# Patient Record
Sex: Male | Born: 1980 | Race: Black or African American | Hispanic: No | Marital: Married
Health system: Southern US, Community
[De-identification: ages and names within clinical notes are randomized; demographics above are authoritative.]

## PROBLEM LIST (undated history)

## (undated) DIAGNOSIS — Z973 Presence of spectacles and contact lenses: Secondary | ICD-10-CM

## (undated) DIAGNOSIS — K219 Gastro-esophageal reflux disease without esophagitis: Secondary | ICD-10-CM

## (undated) DIAGNOSIS — H9190 Unspecified hearing loss, unspecified ear: Secondary | ICD-10-CM

## (undated) DIAGNOSIS — Z8739 Personal history of other diseases of the musculoskeletal system and connective tissue: Secondary | ICD-10-CM

## (undated) DIAGNOSIS — F419 Anxiety disorder, unspecified: Secondary | ICD-10-CM

## (undated) DIAGNOSIS — T7840XA Allergy, unspecified, initial encounter: Secondary | ICD-10-CM

## (undated) DIAGNOSIS — D72819 Decreased white blood cell count, unspecified: Secondary | ICD-10-CM

## (undated) HISTORY — DX: Anxiety disorder, unspecified: F41.9

## (undated) HISTORY — DX: Presence of spectacles and contact lenses: Z97.3

## (undated) HISTORY — DX: Allergy, unspecified, initial encounter: T78.40XA

## (undated) HISTORY — DX: Gastro-esophageal reflux disease without esophagitis: K21.9

## (undated) HISTORY — DX: Personal history of other diseases of the musculoskeletal system and connective tissue: Z87.39

## (undated) HISTORY — DX: Decreased white blood cell count, unspecified: D72.819

## (undated) HISTORY — DX: Unspecified hearing loss, unspecified ear: H91.90

---

## 2004-02-21 ENCOUNTER — Emergency Department (HOSPITAL_COMMUNITY): Admission: EM | Admit: 2004-02-21 | Discharge: 2004-02-21 | Payer: Self-pay | Admitting: Family Medicine

## 2005-12-03 ENCOUNTER — Emergency Department (HOSPITAL_COMMUNITY): Admission: EM | Admit: 2005-12-03 | Discharge: 2005-12-03 | Payer: Self-pay | Admitting: Emergency Medicine

## 2006-07-09 ENCOUNTER — Ambulatory Visit: Payer: Self-pay | Admitting: Family Medicine

## 2006-08-03 ENCOUNTER — Ambulatory Visit: Payer: Self-pay | Admitting: Family Medicine

## 2006-09-14 ENCOUNTER — Ambulatory Visit: Payer: Self-pay | Admitting: Family Medicine

## 2006-11-19 ENCOUNTER — Ambulatory Visit: Payer: Self-pay | Admitting: Family Medicine

## 2006-12-09 ENCOUNTER — Ambulatory Visit: Payer: Self-pay | Admitting: Family Medicine

## 2007-01-12 ENCOUNTER — Ambulatory Visit: Payer: Self-pay | Admitting: Family Medicine

## 2007-02-24 ENCOUNTER — Ambulatory Visit: Payer: Self-pay | Admitting: Family Medicine

## 2007-05-04 ENCOUNTER — Ambulatory Visit: Payer: Self-pay | Admitting: Family Medicine

## 2007-05-08 ENCOUNTER — Emergency Department (HOSPITAL_COMMUNITY): Admission: EM | Admit: 2007-05-08 | Discharge: 2007-05-08 | Payer: Self-pay | Admitting: Emergency Medicine

## 2007-06-13 ENCOUNTER — Ambulatory Visit: Payer: Self-pay | Admitting: Family Medicine

## 2007-07-18 ENCOUNTER — Ambulatory Visit: Payer: Self-pay | Admitting: Family Medicine

## 2007-09-05 ENCOUNTER — Ambulatory Visit: Payer: Self-pay | Admitting: Family Medicine

## 2007-09-21 ENCOUNTER — Ambulatory Visit: Payer: Self-pay | Admitting: Family Medicine

## 2008-02-28 ENCOUNTER — Ambulatory Visit: Payer: Self-pay | Admitting: Family Medicine

## 2008-03-24 ENCOUNTER — Emergency Department (HOSPITAL_COMMUNITY): Admission: EM | Admit: 2008-03-24 | Discharge: 2008-03-24 | Payer: Self-pay | Admitting: Emergency Medicine

## 2008-03-26 ENCOUNTER — Ambulatory Visit: Payer: Self-pay | Admitting: Family Medicine

## 2008-04-02 ENCOUNTER — Ambulatory Visit: Payer: Self-pay | Admitting: Family Medicine

## 2008-04-09 ENCOUNTER — Ambulatory Visit: Payer: Self-pay | Admitting: Family Medicine

## 2008-04-10 ENCOUNTER — Encounter: Admission: RE | Admit: 2008-04-10 | Discharge: 2008-05-03 | Payer: Self-pay | Admitting: Family Medicine

## 2008-04-23 ENCOUNTER — Ambulatory Visit: Payer: Self-pay | Admitting: Family Medicine

## 2008-05-07 ENCOUNTER — Encounter: Admission: RE | Admit: 2008-05-07 | Discharge: 2008-07-11 | Payer: Self-pay | Admitting: Family Medicine

## 2008-05-14 ENCOUNTER — Ambulatory Visit: Payer: Self-pay | Admitting: Family Medicine

## 2008-07-04 ENCOUNTER — Ambulatory Visit: Payer: Self-pay | Admitting: Family Medicine

## 2008-07-04 ENCOUNTER — Encounter: Payer: Self-pay | Admitting: Cardiology

## 2008-08-15 ENCOUNTER — Ambulatory Visit: Payer: Self-pay | Admitting: Family Medicine

## 2008-08-15 ENCOUNTER — Encounter: Payer: Self-pay | Admitting: Cardiology

## 2008-08-23 DIAGNOSIS — R55 Syncope and collapse: Secondary | ICD-10-CM | POA: Insufficient documentation

## 2008-08-23 DIAGNOSIS — I498 Other specified cardiac arrhythmias: Secondary | ICD-10-CM | POA: Insufficient documentation

## 2008-09-04 ENCOUNTER — Encounter: Payer: Self-pay | Admitting: Cardiology

## 2008-09-05 ENCOUNTER — Ambulatory Visit: Payer: Self-pay | Admitting: Cardiology

## 2008-09-05 ENCOUNTER — Encounter: Payer: Self-pay | Admitting: Cardiology

## 2008-09-05 ENCOUNTER — Ambulatory Visit: Payer: Self-pay

## 2008-09-05 DIAGNOSIS — R42 Dizziness and giddiness: Secondary | ICD-10-CM | POA: Insufficient documentation

## 2008-09-05 DIAGNOSIS — R079 Chest pain, unspecified: Secondary | ICD-10-CM | POA: Insufficient documentation

## 2008-09-12 ENCOUNTER — Ambulatory Visit: Payer: Self-pay

## 2008-09-12 ENCOUNTER — Encounter: Payer: Self-pay | Admitting: Cardiology

## 2008-09-20 ENCOUNTER — Encounter: Payer: Self-pay | Admitting: Cardiology

## 2008-09-26 ENCOUNTER — Telehealth: Payer: Self-pay | Admitting: Cardiology

## 2008-10-05 ENCOUNTER — Ambulatory Visit: Payer: Self-pay | Admitting: Family Medicine

## 2008-11-14 ENCOUNTER — Ambulatory Visit: Payer: Self-pay | Admitting: Family Medicine

## 2008-11-23 ENCOUNTER — Ambulatory Visit: Payer: Self-pay | Admitting: Family Medicine

## 2008-12-14 ENCOUNTER — Encounter (INDEPENDENT_AMBULATORY_CARE_PROVIDER_SITE_OTHER): Payer: Self-pay | Admitting: *Deleted

## 2009-01-02 ENCOUNTER — Ambulatory Visit: Payer: Self-pay | Admitting: Family Medicine

## 2009-01-04 ENCOUNTER — Emergency Department (HOSPITAL_COMMUNITY): Admission: EM | Admit: 2009-01-04 | Discharge: 2009-01-04 | Payer: Self-pay | Admitting: Family Medicine

## 2009-02-11 ENCOUNTER — Ambulatory Visit: Payer: Self-pay | Admitting: Family Medicine

## 2009-03-14 ENCOUNTER — Ambulatory Visit: Payer: Self-pay | Admitting: Family Medicine

## 2009-03-21 ENCOUNTER — Ambulatory Visit: Payer: Self-pay | Admitting: Family Medicine

## 2009-04-03 ENCOUNTER — Encounter: Admission: RE | Admit: 2009-04-03 | Discharge: 2009-05-03 | Payer: Self-pay | Admitting: Family Medicine

## 2009-05-01 ENCOUNTER — Ambulatory Visit: Payer: Self-pay | Admitting: Family Medicine

## 2009-05-09 ENCOUNTER — Encounter: Admission: RE | Admit: 2009-05-09 | Discharge: 2009-05-28 | Payer: Self-pay | Admitting: Family Medicine

## 2009-05-28 ENCOUNTER — Ambulatory Visit: Payer: Self-pay | Admitting: Family Medicine

## 2009-05-28 ENCOUNTER — Encounter: Admission: RE | Admit: 2009-05-28 | Discharge: 2009-05-28 | Payer: Self-pay | Admitting: Family Medicine

## 2009-07-18 ENCOUNTER — Ambulatory Visit: Payer: Self-pay | Admitting: Family Medicine

## 2009-07-25 ENCOUNTER — Ambulatory Visit: Payer: Self-pay | Admitting: Family Medicine

## 2009-07-30 ENCOUNTER — Ambulatory Visit: Payer: Self-pay | Admitting: Hematology and Oncology

## 2009-08-02 LAB — CBC WITH DIFFERENTIAL/PLATELET
BASO%: 0.4 % (ref 0.0–2.0)
Basophils Absolute: 0 10*3/uL (ref 0.0–0.1)
EOS%: 1.2 % (ref 0.0–7.0)
Eosinophils Absolute: 0 10*3/uL (ref 0.0–0.5)
HCT: 47.5 % (ref 38.4–49.9)
HGB: 16 g/dL (ref 13.0–17.1)
LYMPH%: 54.9 % — ABNORMAL HIGH (ref 14.0–49.0)
MCH: 29.3 pg (ref 27.2–33.4)
MCHC: 33.8 g/dL (ref 32.0–36.0)
MCV: 86.7 fL (ref 79.3–98.0)
MONO#: 0.3 10*3/uL (ref 0.1–0.9)
MONO%: 10 % (ref 0.0–14.0)
NEUT#: 1.2 10*3/uL — ABNORMAL LOW (ref 1.5–6.5)
NEUT%: 33.5 % — ABNORMAL LOW (ref 39.0–75.0)
Platelets: 248 10*3/uL (ref 140–400)
RBC: 5.47 10*6/uL (ref 4.20–5.82)
RDW: 13.3 % (ref 11.0–14.6)
WBC: 3.5 10*3/uL — ABNORMAL LOW (ref 4.0–10.3)
lymph#: 1.9 10*3/uL (ref 0.9–3.3)

## 2009-08-02 LAB — MORPHOLOGY: PLT EST: ADEQUATE

## 2009-08-05 LAB — ANA: Anti Nuclear Antibody(ANA): NEGATIVE

## 2009-08-05 LAB — HIV ANTIBODY (ROUTINE TESTING W REFLEX)

## 2009-08-05 LAB — VITAMIN B12: Vitamin B-12: 776 pg/mL (ref 211–911)

## 2009-08-15 ENCOUNTER — Ambulatory Visit (HOSPITAL_COMMUNITY): Admission: RE | Admit: 2009-08-15 | Discharge: 2009-08-15 | Payer: Self-pay | Admitting: Hematology and Oncology

## 2009-08-27 ENCOUNTER — Ambulatory Visit: Payer: Self-pay | Admitting: Physician Assistant

## 2009-10-28 ENCOUNTER — Ambulatory Visit: Payer: Self-pay | Admitting: Physician Assistant

## 2009-12-02 ENCOUNTER — Ambulatory Visit: Payer: Self-pay | Admitting: Family Medicine

## 2009-12-09 ENCOUNTER — Ambulatory Visit: Payer: Self-pay | Admitting: Family Medicine

## 2010-01-27 ENCOUNTER — Ambulatory Visit: Payer: Self-pay | Admitting: Physician Assistant

## 2010-02-07 ENCOUNTER — Ambulatory Visit: Payer: Self-pay | Admitting: Hematology and Oncology

## 2010-02-17 ENCOUNTER — Ambulatory Visit: Payer: Self-pay | Admitting: Family Medicine

## 2010-02-20 ENCOUNTER — Other Ambulatory Visit: Admission: RE | Admit: 2010-02-20 | Discharge: 2010-02-20 | Payer: Self-pay | Admitting: Hematology and Oncology

## 2010-02-20 LAB — COMPREHENSIVE METABOLIC PANEL
ALT: 14 U/L (ref 0–53)
AST: 23 U/L (ref 0–37)
Albumin: 4.5 g/dL (ref 3.5–5.2)
Alkaline Phosphatase: 60 U/L (ref 39–117)
BUN: 14 mg/dL (ref 6–23)
CO2: 27 mEq/L (ref 19–32)
Calcium: 9.9 mg/dL (ref 8.4–10.5)
Chloride: 103 mEq/L (ref 96–112)
Creatinine, Ser: 0.98 mg/dL (ref 0.40–1.50)
Glucose, Bld: 67 mg/dL — ABNORMAL LOW (ref 70–99)
Potassium: 4.4 mEq/L (ref 3.5–5.3)
Sodium: 140 mEq/L (ref 135–145)
Total Bilirubin: 0.7 mg/dL (ref 0.3–1.2)
Total Protein: 7.3 g/dL (ref 6.0–8.3)

## 2010-02-20 LAB — CBC WITH DIFFERENTIAL/PLATELET
BASO%: 0.5 % (ref 0.0–2.0)
Basophils Absolute: 0 10*3/uL (ref 0.0–0.1)
EOS%: 2.3 % (ref 0.0–7.0)
Eosinophils Absolute: 0.1 10*3/uL (ref 0.0–0.5)
HCT: 47.7 % (ref 38.4–49.9)
HGB: 16.3 g/dL (ref 13.0–17.1)
LYMPH%: 51.8 % — ABNORMAL HIGH (ref 14.0–49.0)
MCH: 29.7 pg (ref 27.2–33.4)
MCHC: 34.1 g/dL (ref 32.0–36.0)
MCV: 87.1 fL (ref 79.3–98.0)
MONO#: 0.5 10*3/uL (ref 0.1–0.9)
MONO%: 11.3 % (ref 0.0–14.0)
NEUT#: 1.6 10*3/uL (ref 1.5–6.5)
NEUT%: 34.1 % — ABNORMAL LOW (ref 39.0–75.0)
Platelets: 242 10*3/uL (ref 140–400)
RBC: 5.48 10*6/uL (ref 4.20–5.82)
RDW: 13.7 % (ref 11.0–14.6)
WBC: 4.6 10*3/uL (ref 4.0–10.3)
lymph#: 2.4 10*3/uL (ref 0.9–3.3)

## 2010-02-21 LAB — FLOW CYTOMETRY

## 2010-03-24 ENCOUNTER — Ambulatory Visit: Payer: Self-pay | Admitting: Family Medicine

## 2010-05-21 ENCOUNTER — Ambulatory Visit
Admission: RE | Admit: 2010-05-21 | Discharge: 2010-05-21 | Payer: Self-pay | Source: Home / Self Care | Attending: Family Medicine | Admitting: Family Medicine

## 2010-05-25 ENCOUNTER — Encounter: Payer: Self-pay | Admitting: Hematology and Oncology

## 2010-07-10 ENCOUNTER — Ambulatory Visit (INDEPENDENT_AMBULATORY_CARE_PROVIDER_SITE_OTHER): Payer: BC Managed Care – PPO | Admitting: Family Medicine

## 2010-07-10 DIAGNOSIS — H109 Unspecified conjunctivitis: Secondary | ICD-10-CM

## 2010-07-10 DIAGNOSIS — M549 Dorsalgia, unspecified: Secondary | ICD-10-CM

## 2010-07-10 DIAGNOSIS — R071 Chest pain on breathing: Secondary | ICD-10-CM

## 2010-07-15 ENCOUNTER — Ambulatory Visit: Payer: BC Managed Care – PPO | Admitting: Family Medicine

## 2010-07-15 ENCOUNTER — Ambulatory Visit (INDEPENDENT_AMBULATORY_CARE_PROVIDER_SITE_OTHER): Payer: BC Managed Care – PPO | Admitting: Family Medicine

## 2010-07-15 DIAGNOSIS — L989 Disorder of the skin and subcutaneous tissue, unspecified: Secondary | ICD-10-CM

## 2010-07-15 DIAGNOSIS — R42 Dizziness and giddiness: Secondary | ICD-10-CM

## 2010-08-21 ENCOUNTER — Other Ambulatory Visit: Payer: Self-pay | Admitting: Hematology and Oncology

## 2010-08-21 ENCOUNTER — Encounter (HOSPITAL_BASED_OUTPATIENT_CLINIC_OR_DEPARTMENT_OTHER): Payer: BC Managed Care – PPO | Admitting: Hematology and Oncology

## 2010-08-21 DIAGNOSIS — D72819 Decreased white blood cell count, unspecified: Secondary | ICD-10-CM

## 2010-08-21 LAB — CBC WITH DIFFERENTIAL/PLATELET
BASO%: 0.5 % (ref 0.0–2.0)
Basophils Absolute: 0 10*3/uL (ref 0.0–0.1)
EOS%: 2.3 % (ref 0.0–7.0)
Eosinophils Absolute: 0.1 10*3/uL (ref 0.0–0.5)
HCT: 44.3 % (ref 38.4–49.9)
HGB: 15.3 g/dL (ref 13.0–17.1)
LYMPH%: 44.3 % (ref 14.0–49.0)
MCH: 29.6 pg (ref 27.2–33.4)
MCHC: 34.5 g/dL (ref 32.0–36.0)
MCV: 85.7 fL (ref 79.3–98.0)
MONO#: 0.4 10*3/uL (ref 0.1–0.9)
MONO%: 11.2 % (ref 0.0–14.0)
NEUT#: 1.5 10*3/uL (ref 1.5–6.5)
NEUT%: 41.7 % (ref 39.0–75.0)
Platelets: 233 10*3/uL (ref 140–400)
RBC: 5.18 10*6/uL (ref 4.20–5.82)
RDW: 13.2 % (ref 11.0–14.6)
WBC: 3.7 10*3/uL — ABNORMAL LOW (ref 4.0–10.3)
lymph#: 1.6 10*3/uL (ref 0.9–3.3)

## 2010-08-21 LAB — BASIC METABOLIC PANEL
BUN: 10 mg/dL (ref 6–23)
CO2: 29 mEq/L (ref 19–32)
Calcium: 10 mg/dL (ref 8.4–10.5)
Chloride: 103 mEq/L (ref 96–112)
Creatinine, Ser: 1.06 mg/dL (ref 0.40–1.50)
Glucose, Bld: 71 mg/dL (ref 70–99)
Potassium: 4.2 mEq/L (ref 3.5–5.3)
Sodium: 139 mEq/L (ref 135–145)

## 2010-08-22 ENCOUNTER — Ambulatory Visit (INDEPENDENT_AMBULATORY_CARE_PROVIDER_SITE_OTHER): Payer: BC Managed Care – PPO | Admitting: Family Medicine

## 2010-08-22 DIAGNOSIS — R0982 Postnasal drip: Secondary | ICD-10-CM

## 2010-08-22 DIAGNOSIS — H9319 Tinnitus, unspecified ear: Secondary | ICD-10-CM

## 2010-08-22 DIAGNOSIS — R42 Dizziness and giddiness: Secondary | ICD-10-CM

## 2010-10-17 ENCOUNTER — Encounter: Payer: Self-pay | Admitting: Family Medicine

## 2010-10-17 ENCOUNTER — Ambulatory Visit (INDEPENDENT_AMBULATORY_CARE_PROVIDER_SITE_OTHER): Payer: BC Managed Care – PPO | Admitting: Family Medicine

## 2010-10-17 VITALS — BP 118/80 | Temp 98.4°F | Wt 152.0 lb

## 2010-10-17 DIAGNOSIS — K219 Gastro-esophageal reflux disease without esophagitis: Secondary | ICD-10-CM

## 2010-10-17 DIAGNOSIS — J029 Acute pharyngitis, unspecified: Secondary | ICD-10-CM

## 2010-10-17 NOTE — Progress Notes (Signed)
  Subjective:    Patient ID: Ryan Russell, male    DOB: [birth year], 30 y.o.   MRN: [id number]  HPI He has a five-day history that started with sore throat, chest congestion and tightness with some ear congestion. No fever or chills. He does have a history of reflux disease and presently is on Prilosec   Review of Systems     Objective:   Physical Examalert and in no distress. Tympanic membranes and canals are normal. Throat is clear. Tonsils are normal. Neck is supple without adenopathy or thyromegaly. Cardiac exam shows a regular sinus rhythm without murmurs or gallops. Lungs are clear to auscultation. Screen negative        Assessment & Plan:  Viral pharyngitis. GERD Continue on the Prilosec as needed. Treat you sore throat symptoms as needed with over-the-counter meds

## 2010-10-20 ENCOUNTER — Ambulatory Visit: Payer: BC Managed Care – PPO | Admitting: Medical

## 2010-11-06 ENCOUNTER — Encounter: Payer: Self-pay | Admitting: Family Medicine

## 2010-11-06 ENCOUNTER — Encounter: Payer: Self-pay | Admitting: *Deleted

## 2010-11-07 ENCOUNTER — Encounter: Payer: Self-pay | Admitting: Family Medicine

## 2010-11-07 ENCOUNTER — Ambulatory Visit (INDEPENDENT_AMBULATORY_CARE_PROVIDER_SITE_OTHER): Payer: BC Managed Care – PPO | Admitting: Family Medicine

## 2010-11-07 VITALS — BP 112/74 | HR 64 | Temp 97.9°F | Ht 71.0 in | Wt 152.0 lb

## 2010-11-07 DIAGNOSIS — J029 Acute pharyngitis, unspecified: Secondary | ICD-10-CM

## 2010-11-07 DIAGNOSIS — J309 Allergic rhinitis, unspecified: Secondary | ICD-10-CM

## 2010-11-07 DIAGNOSIS — K219 Gastro-esophageal reflux disease without esophagitis: Secondary | ICD-10-CM

## 2010-11-07 MED ORDER — DEXLANSOPRAZOLE 60 MG PO CPDR: 60.0000 mg | DELAYED_RELEASE_CAPSULE | Freq: Every day | ORAL | Status: DC

## 2010-11-07 NOTE — Patient Instructions (Signed)
Your sore throat is contributed by both allergies and reflux.  Take the kapidex samples in place of omeprazole.  Start taking an antihistamine (either Zyrtec or Claritin), along with Mucinex.  After completing the Kapidex samples, you can try going back to the omeprazole.  If you get recurrent symptoms, then call for Kapidex prescription.  Follow-up immediately if you develop high fever, worsening sore throat, or other new complaints

## 2010-11-07 NOTE — Progress Notes (Signed)
  Subjective:    Patient ID: Ryan Russell, male    DOB: December 13, 1980, 30 y.o.   MRN: [id number]  HPI Patient presents with complaint of sore throat.  Having pain R neck/jaw for 2 weeks.  Noticed swollen neck/glands and pain with swallowing.  Has gotten somewhat better over the last 2 weeks, but seems to flare up, and comes and goes.  Had some fevers 2 weeks ago (subjective, lowgrade), not recently.  Has some postnasal drip, some sneezing, no runny nose.  After eating, he feels like he needs to clear his throat and expectorate mucus.  He has a h/o reflux, for which he takes omeprazole 40mg .  Has made some changes to his diet and has gotten a little better recently, but in general, has a lot of reflux symptoms/chest discomfort. + cough intermittently, bad last night.  Denies SOB  Past Medical History  Diagnosis Date  . Allergy   . GERD (gastroesophageal reflux disease)   . Anxiety   . Leukopenia    History reviewed. No pertinent past surgical history. No Known Allergies  Review of Systems Denies fevers, headache (occasional slight headache).  Denies nausea, vomiting, diarrhea.  Denies rash or other concerns. See HPI    Objective:   Physical Exam BP 112/74  Pulse 64  Temp(Src) 97.9 F (36.6 C) (Oral)  Ht 5\' 11"  (1.803 m)  Wt 152 lb (68.947 kg)  BMI 21.20 kg/m2 Well developed male, appears sleepy (worked 3rd shift), otherwise no distress HEENT: PERRL, EOMI, conjunctiva clear.  Nose--moderate edema, clear-white mucus on left, crusting on right.  Sinuses nontender.  OP--cobblestoning posteriorly, no erythema or exudates.  Mucus membranes moist Neck:  No significant lymphadenopathy or mass Abdomen: mild epigastric tenderness, no hepatosplenomegaly, rebound or guarding Chest:  nontender Heart: regular rate and rhythm without murmurs Lungs: clear bilaterally     Assessment & Plan:   1. Pharyngitis   2. GERD (gastroesophageal reflux disease)   3. Allergic rhinitis, cause unspecified     Pharyngitis--contributed by allergies and postnasal drip, as well as GERD. Recommend Claritin or Zyrtec, as well as Mucinex.  Samples of Kapidex given to use in place of Nexium.  Rx savings card was given in case he prefers to stay on this (if gets recurrent symptoms after going back to omeprazole)

## 2010-12-09 ENCOUNTER — Telehealth: Payer: Self-pay | Admitting: Family Medicine

## 2010-12-09 ENCOUNTER — Telehealth: Payer: Self-pay

## 2010-12-09 MED ORDER — DEXLANSOPRAZOLE 60 MG PO CPDR: 60.0000 mg | DELAYED_RELEASE_CAPSULE | Freq: Every day | ORAL | Status: DC

## 2010-12-09 NOTE — Telephone Encounter (Signed)
Let him know that I called the medication in for him

## 2010-12-09 NOTE — Telephone Encounter (Signed)
Patient tried Prilosec and found his symptoms returned. He got better results with Dexilant. This medicine was called in

## 2010-12-09 NOTE — Telephone Encounter (Signed)
Called pt to let him know med dexalent was called in

## 2011-01-09 ENCOUNTER — Ambulatory Visit (INDEPENDENT_AMBULATORY_CARE_PROVIDER_SITE_OTHER): Payer: BC Managed Care – PPO | Admitting: Medical

## 2011-01-09 ENCOUNTER — Encounter: Payer: Self-pay | Admitting: Medical

## 2011-01-09 DIAGNOSIS — L299 Pruritus, unspecified: Secondary | ICD-10-CM

## 2011-01-09 DIAGNOSIS — M25539 Pain in unspecified wrist: Secondary | ICD-10-CM

## 2011-01-09 DIAGNOSIS — R079 Chest pain, unspecified: Secondary | ICD-10-CM

## 2011-01-09 LAB — BASIC METABOLIC PANEL
BUN: 12 mg/dL (ref 6–23)
CO2: 29 mEq/L (ref 19–32)
Calcium: 9.9 mg/dL (ref 8.4–10.5)
Chloride: 102 mEq/L (ref 96–112)
Creat: 1.05 mg/dL (ref 0.50–1.35)
Glucose, Bld: 64 mg/dL — ABNORMAL LOW (ref 70–99)
Potassium: 3.9 mEq/L (ref 3.5–5.3)
Sodium: 138 mEq/L (ref 135–145)

## 2011-01-09 LAB — CBC WITH DIFFERENTIAL/PLATELET
Basophils Absolute: 0 10*3/uL (ref 0.0–0.1)
Basophils Relative: 1 % (ref 0–1)
Eosinophils Absolute: 0.1 10*3/uL (ref 0.0–0.7)
Eosinophils Relative: 2 % (ref 0–5)
HCT: 46.7 % (ref 39.0–52.0)
Hemoglobin: 15.8 g/dL (ref 13.0–17.0)
Lymphocytes Relative: 60 % — ABNORMAL HIGH (ref 12–46)
Lymphs Abs: 2.1 10*3/uL (ref 0.7–4.0)
MCH: 28.8 pg (ref 26.0–34.0)
MCHC: 33.8 g/dL (ref 30.0–36.0)
MCV: 85.1 fL (ref 78.0–100.0)
Monocytes Absolute: 0.3 10*3/uL (ref 0.1–1.0)
Monocytes Relative: 8 % (ref 3–12)
Neutro Abs: 1 10*3/uL — ABNORMAL LOW (ref 1.7–7.7)
Neutrophils Relative %: 29 % — ABNORMAL LOW (ref 43–77)
Platelets: 236 10*3/uL (ref 150–400)
RBC: 5.49 MIL/uL (ref 4.22–5.81)
RDW: 12.6 % (ref 11.5–15.5)
WBC: 3.5 10*3/uL — ABNORMAL LOW (ref 4.0–10.5)

## 2011-01-09 LAB — TSH: TSH: 1.132 u[IU]/mL (ref 0.350–4.500)

## 2011-01-09 NOTE — Patient Instructions (Signed)
Begin Zyrtec at bedtime for itching.  Use OTC Aleve once to twice daily as needed or daily in general for chest and wrist pain.  Stretch regularly.  Try and find a way to get back to exercising at least 30 - 60 minutes at least 3 times per week.

## 2011-01-09 NOTE — Progress Notes (Signed)
Subjective:   HPI  Ryan Russell is a 30 y.o. male who presents for multiple c/o.    His first c/o is chest soreness and pressure on the left side for a month, intermittent.  Usually occurs at work.  He works on a Clinical research associate at [employer].  He twists and lifts boxes all day.  He use to run marathons and run regularly, but since work has gotten more busy, he hasn't been exercising of recent.  He denies pain or pressure with exercise.  He denies SOB, palpitations, LE edema.  He is around cigarette smoke and gets passive smoke, but is a nonsmoker himself.    He notes pain in both wrists intermittent.  Denies injury or trauma.  Denies numbness, tingling, and weakness of hands.  No swelling.  He notes itching of his arms intermittent, usually worse at work.  Wonders if this is a food allergy.  Denies rash or specific new exposure.  No other aggravating or relieving factors.  No other c/o.  The following portions of the patient's history were reviewed and updated as appropriate: allergies, current medications, past family history, past medical history, past social history, past surgical history and problem list.  Past Medical History  Diagnosis Date  . Allergy   . GERD (gastroesophageal reflux disease)   . Anxiety   . Leukopenia    Review of Systems Constitutional: denies fever, chills, sweats, unexpected weight change, anorexia, fatigue Allergy: no congestion, sneezing Dermatology: denies rash ENT: no runny nose, ear pain, sore throat, hoarseness, sinus pain Cardiology: denies palpitations, edema Respiratory: denies cough, shortness of breath, wheezing Gastroenterology: denies abdominal pain, nausea, vomiting, diarrhea, constipation Hematology: denies bleeding or bruising problems Musculoskeletal: denies joint swelling, back pain Urology: denies dysuria, difficulty urinating, hematuria, urinary frequency, urgency Neurology: no headache, weakness, tingling, numbness      Objective:   Physical Exam  General appearance: alert, no distress, WD/WN, black male, thin Skin: no rash, somewhat dry though Oral cavity: MMM, no lesions Neck: supple, no lymphadenopathy, no thyromegaly, no masses, no JVD Chest: non tender Heart: RRR, normal S1, S2, no murmurs Lungs: CTA bilaterally, no wheezes, rhonchi, or rales Back: non tender Musculoskeletal: mild tenderness of bilat wrists, L>R, but no obvious deformity, otherwise hands and arms nontender, no swelling, no obvious deformity Extremities: no edema, no cyanosis, no clubbing Pulses: 2+ symmetric, upper and lower extremities, normal cap refill Neurological: normal strength and sensation of hands and arms  Assessment :    Encounter Diagnoses  Name Primary?  . Chest pain Yes  . Wrist pain   . Itching     Plan:    EKG today with nsr, rate 61bpm, normal intervals, axis 76, RSR with right ventricular conduction delay, no acute change, left atrial enlargement, and although I don't have prior EKG, prior cardiology note EKG reading seems unchanged in regard to right ventricular conduction delay.  Of note, he saw cardiology for similar 09/2008 and had holter and echo for similar symptoms, and per pt report, results were normal.  I have the cardiology office note, but no holter or echo results.   Advised that given his work history and physical activity at work, I suspect this pain/pressure is musculoskeletal and non cardiac.  Advised he use OTC Aleve for the chest and wrist symptoms as both seem activity related.  In general use stretching and relative rest.  Recheck in 59mo.  Itching - begin OTC Zyrtec QHS.  Etiology unclear, but could be allergen trigger.

## 2011-01-12 ENCOUNTER — Telehealth: Payer: Self-pay | Admitting: *Deleted

## 2011-01-12 NOTE — Telephone Encounter (Addendum)
Message copied by Dorthula Perfect on Mon Jan 12, 2011 11:10 AM ------      Message from: Jac Canavan      Created: Mon Jan 12, 2011  8:57 AM       His WBC are a little low, but he has seen hematology about the blood count concerns, last recheck 4/12.  His blood sugar was low though.  Make sure he is eating 3 meals daily, including protein (beans, meat, nuts, cheese) and carbohydrates (whole grains, fruits, vegetable).  Otherwise labs ok.  Use Aleve as we discussed for the chest/musculoskeletal pain, recheck in 79mo.   Pt notified of lab results. Pt scheduled to return 02-11-11 for a follow up. CM, LPN

## 2011-01-13 ENCOUNTER — Telehealth: Payer: Self-pay | Admitting: Medical

## 2011-01-13 NOTE — Telephone Encounter (Signed)
Note for work is fine.  Pls pull chart so i can look back over this.

## 2011-01-13 NOTE — Telephone Encounter (Signed)
Pt would like a note for Friday and Monday. Was in here Friday and we can give his a note for that. But pt was also out of work on Monday stating he was tired and fatigued. States feeling drained and over tired. Please inform of what you would like [place] to do.

## 2011-01-14 ENCOUNTER — Encounter: Payer: Self-pay | Admitting: Medical

## 2011-01-14 NOTE — Telephone Encounter (Signed)
Out of work written and pt came by to pick up

## 2011-01-22 LAB — POCT URINALYSIS DIP (DEVICE)
Glucose, UA: NEGATIVE
Hgb urine dipstick: NEGATIVE
Nitrite: NEGATIVE
Operator id: 235561
Protein, ur: NEGATIVE
Specific Gravity, Urine: 1.015
Urobilinogen, UA: 1
pH: 7

## 2011-01-30 ENCOUNTER — Encounter: Payer: Self-pay | Admitting: Medical

## 2011-01-30 ENCOUNTER — Ambulatory Visit (INDEPENDENT_AMBULATORY_CARE_PROVIDER_SITE_OTHER): Payer: BC Managed Care – PPO | Admitting: Medical

## 2011-01-30 VITALS — BP 120/80 | HR 62 | Temp 98.0°F | Resp 16 | Ht 71.0 in | Wt 154.0 lb

## 2011-01-30 DIAGNOSIS — R079 Chest pain, unspecified: Secondary | ICD-10-CM

## 2011-01-30 DIAGNOSIS — K219 Gastro-esophageal reflux disease without esophagitis: Secondary | ICD-10-CM

## 2011-01-30 DIAGNOSIS — L732 Hidradenitis suppurativa: Secondary | ICD-10-CM

## 2011-01-30 MED ORDER — DOXYCYCLINE HYCLATE 100 MG PO TABS: 100.0000 mg | ORAL_TABLET | Freq: Two times a day (BID) | ORAL | Status: DC

## 2011-01-30 NOTE — Progress Notes (Signed)
  Subjective:   HPI Ryan Russell is a 30 y.o. male who presents for tender bump under left arm and pain radiating to chest.  He notes that he started having pain and a bump under left arm a few days ago, and the pain radiated to the chest wall.  He chose to stay out of work due to the pain the last few days.  He denies fever, redness, drainage.  He denies hx/o abscess or pus or similar issue prior.    I saw him a few weeks ago for chest pain and itching all over.  This pain and itching seemed to resolve.  This new pain seems to be related to the tender area under the left arm.    He has hx/o GERD, not well controlled on current medication.  He does note occasional heartburn, chest pain. No other aggravating or relieving factors.  No other c/o.  The following portions of the patient's history were reviewed and updated as appropriate: allergies, current medications, past family history, past medical history, past social history, past surgical history and problem list.  Past Medical History  Diagnosis Date  . Allergy   . GERD (gastroesophageal reflux disease)   . Anxiety   . Leukopenia    Review of Systems Gen: +fatigue; no fever, chills, sweats, wight changes Skin: no rash HEENT: negative Heart: +CP, radiating from armpit, no palpitations, edema, DOE Lungs: no cough, SOB, orthopnea MSK: tender along chest, no joint pain or swelling, no back or neck pain Ext: no edema GI: no pain, NVD    Objective:   Physical Exam  General appearance: alert, no distress, WD/WN, lean black male Skin: no erythema, fluctuance, or warmth of either axilla Neck: supple, no lymphadenopathy, no thyromegaly, no mass Lungs: CTA bilaterally Heart: RRR, normal S1, S2, no murmur Extremities: No edema, no cyanosis or clubbing MSK: slight tenderness of left axilla, but no obvious mass or nodule Pulses: 2+ throughout Chest wall: Mild tenderness throughout anterior and posterior chest wall, normal shape and  expansion    Assessment :    Encounter Diagnoses  Name Primary?  . Hidradenitis axillaris Yes  . GERD (gastroesophageal reflux disease)   . Chest pain     Plan:     Hidradenitis-advise warm moist compresses, and avoid deodorant for the next 3-5 days until this resolves.  If worsening tenderness, redness, tender nodule, begin doxycycline.  Prescription given just in case.  GERD-stop Prilosec, begin trial of Nexium  Chest pain - reassured that his exam and symptoms today suggest chest wall/musculoskeletal inflammation tenderness, noncardiac issue.  Advise he stretch daily, use NSAIDs or Tylenol OTC as needed.  Of note, he has seen cardiology and GI in the past for similar symptoms without serious cardiac etiology.

## 2011-01-30 NOTE — Patient Instructions (Signed)
Hidradenitis Suppurativa, Sweat Gland Abscess Hidradenitis suppurativa is a long lasting (chronic), uncommon disease of the sweat glands. With this, boil-like lumps and scarring develop in the groin, some times under the arms (axillae), and under the breasts. It may also uncommonly occur behind the ears, in the crease of the buttocks, and around the genitals.  CAUSES The cause is from a blocking of the sweat glands. They then become infected. It may cause drainage and odor. It is not contagious. So it cannot be given to someone else. It most often shows up in puberty (about 10 to 29 years of age). But it may happen much later. It is similar to acne which is a disease of the sweat glands. This condition is slightly more common in African-Americans and women. SYMPTOMS  Hidradenitis usually starts as one or more red, tender, swellings in the groin or under the arms (axilla).   Over a period of hours to days the lesions get larger. They often open to the skin surface, draining clear to yellow-colored fluid.   The infected area heals with scarring.  DIAGNOSIS Your caregiver makes this diagnosis by looking at you. Sometimes cultures (growing germs on plates in the lab) may be taken. This is to see what germ (bacterium) is causing the infection.  TREATMENT & HOME CARE INSTRUCTIONS  Topical germ killing medicine applied to the skin (antibiotics) are the treatment of choice. Antibiotics taken by mouth (systemic) are sometimes needed when the condition is getting worse or is severe.   Avoid tight-fitting clothing which traps moisture in.   Dirt does not cause hidradenitis and it is not caused by poor hygiene.   Involved areas should be cleaned daily using an antibacterial soap. Some patients find that the liquid form of Lever 2000, applied to the involved areas as a lotion after bathing, can help reduce the odor related to this condition.   Sometimes surgery is needed to drain infected areas or remove  scarred tissue. Removal of large amounts of tissue is used only in severe cases.   Birth control pills may be helpful.   Oral retinoids (vitamin A derivatives) for 6 to 12 months which are effective for acne may also help this condition.   Weight loss will improve but not cure hidradenitis. It is made worse by being overweight. But the condition is not caused by being overweight.   This condition is more common in people who have had acne.   It may become worse under stress.  There is no medical cure for hidradenitis. It can be controlled, but not cured. The condition usually continues for years with periods of getting worse and getting better (remission). Document Released: 12/03/2003 Document Re-Released: 01/28/2008 ExitCare Patient Information 2011 [employer]. 

## 2011-02-11 ENCOUNTER — Ambulatory Visit: Payer: BC Managed Care – PPO | Admitting: Medical

## 2011-02-12 ENCOUNTER — Encounter: Payer: Self-pay | Admitting: Medical

## 2011-02-12 ENCOUNTER — Ambulatory Visit (INDEPENDENT_AMBULATORY_CARE_PROVIDER_SITE_OTHER): Payer: BC Managed Care – PPO | Admitting: Medical

## 2011-02-12 VITALS — BP 110/80 | HR 72 | Temp 97.8°F | Resp 18 | Ht 71.0 in | Wt 152.0 lb

## 2011-02-12 DIAGNOSIS — Z719 Counseling, unspecified: Secondary | ICD-10-CM

## 2011-02-12 DIAGNOSIS — K219 Gastro-esophageal reflux disease without esophagitis: Secondary | ICD-10-CM

## 2011-02-12 DIAGNOSIS — R071 Chest pain on breathing: Secondary | ICD-10-CM

## 2011-02-12 DIAGNOSIS — R0789 Other chest pain: Secondary | ICD-10-CM

## 2011-02-12 DIAGNOSIS — F430 Acute stress reaction: Secondary | ICD-10-CM

## 2011-02-12 DIAGNOSIS — L732 Hidradenitis suppurativa: Secondary | ICD-10-CM

## 2011-02-12 MED ORDER — DEXLANSOPRAZOLE 60 MG PO CPDR: 60.0000 mg | DELAYED_RELEASE_CAPSULE | Freq: Every day | ORAL | Status: DC

## 2011-02-12 NOTE — Progress Notes (Signed)
Addended by: Jac Canavan on: 02/12/2011 02:32 PM   Modules accepted: Orders

## 2011-02-12 NOTE — Progress Notes (Signed)
Subjective:   HPI  Ryan Russell is a 30 y.o. male who presents for recheck.    I saw him recently for multiple issues.  He had early hidradenitis inflammation last visit, prescribed Doxycycline if things worsened, but he didn't take it and the lesion cleared up.     He tried the samples of Nexium and saw marginal improvement over Prilosec for GERD.  He tries to avoid trigger foods.    He says he went to allergist in the last few months, was advised he was allergic to dust and wondered if this was causing his recent chest pains.  He has been known to have musculoskeletal chest pains fairly frequently in the past.  This cleared up after last visit and he currently doesn't c/o chest pain.  He notes concerns with life stressors in general.  Wonders if this represents anxiety vs ADD vs other issue.   He says he is not where he wants to be in life.  He has worked part time at [employer] for almost 8 years.  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  He is and has been living with his parents for a while.  He says he is just going through the motions currently, and sometimes feels as though he has no specific purpose in life.  He was close to being married not too long ago, but things didn't work out.   He notes financial stress, he has some small debt other than current enrolled in college, and he notes time constraints.  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  He has been prescribed Lexapro and Zoloft prior, but  Didn't take either for about a month each with no improvement in mood.   He notes that both medications caused drowsiness, so he didn't continue taking them.   The following portions of the patient's history were reviewed and updated as appropriate: allergies, current medications, past family history, past medical history, past social history, past surgical history and problem list.  Past Medical  History  Diagnosis Date  . Allergy   . GERD (gastroesophageal reflux disease)   . Anxiety   . Leukopenia     No Known Allergies  Current Outpatient Prescriptions on File Prior to Visit  Medication Sig Dispense Refill  . omeprazole (PRILOSEC) 40 MG capsule Take 40 mg by mouth daily.         Review of Systems Constitutional: denies fever, chills, sweats, unexpected weight change, fatigue Cardiology: denies chest pain, palpitations, edema Respiratory: denies cough, shortness of breath, wheezing Gastroenterology: denies abdominal pain, nausea, vomiting, diarrhea, constipation  Musculoskeletal: denies arthralgias, myalgias, joint swelling, back pain Urology: denies dysuria, difficulty urinating, hematuria, urinary frequency, urgency Neurology: no headache, weakness, tingling, numbness      Objective:   Physical Exam  General appearance: alert, no distress, WD/WN Psych - somewhat poor eye contact, somewhat flat affect, but answers questions appropriately   Assessment :    Encounter Diagnoses  Name Primary?  . Hidradenitis axillaris Yes  . GERD (gastroesophageal reflux disease)   . Acute stress reaction   . Chest wall pain   . Counseling NOS    Plan:     Hidradenitis - resolved  GERD - switch to samples of Dexilant, avoid food triggers, call report 2wk  Acute stress reaction/Counseling - spent long time discussing his life, current place in life, and his stressors.  I tried to encourage him to look at his place in life more positive.  He can use this time while living at home to take advantage of getting finances in order, not having to worry about all the bills that come with being totally independent, use this time to focus on his education, setting goals, but making sure he has priorities in the right place.   We talked about balance, being single and having a level of freedom to be mobile and pursue things now while he is in a position to do so.   Counseled for about 30  min.  We discussed medications, but he doesn't feel the need to be on any medication at this time for stress/anxiety.  Recheck prn.  Chest wall pain - resolved

## 2011-03-05 ENCOUNTER — Other Ambulatory Visit: Payer: Self-pay | Admitting: Medical

## 2011-03-05 ENCOUNTER — Telehealth: Payer: Self-pay | Admitting: Medical

## 2011-03-05 MED ORDER — DEXLANSOPRAZOLE 60 MG PO CPDR: 60.0000 mg | DELAYED_RELEASE_CAPSULE | Freq: Every day | ORAL | Status: DC

## 2011-03-05 NOTE — Telephone Encounter (Signed)
Patient was notified that Rx was sent and i left him 2 boxes of samples up front. CLS

## 2011-03-05 NOTE — Telephone Encounter (Signed)
I sent script.  See if we have some coupon cards and another box or 2 of samples of Dexilant 60mg  for him.

## 2011-03-25 ENCOUNTER — Telehealth: Payer: Self-pay | Admitting: Family Medicine

## 2011-03-25 NOTE — Telephone Encounter (Signed)
DONE

## 2011-05-11 ENCOUNTER — Ambulatory Visit (INDEPENDENT_AMBULATORY_CARE_PROVIDER_SITE_OTHER): Payer: BC Managed Care – PPO | Admitting: Medical

## 2011-05-11 ENCOUNTER — Encounter: Payer: Self-pay | Admitting: Medical

## 2011-05-11 VITALS — BP 112/80 | HR 96 | Temp 97.6°F | Resp 16 | Wt 151.0 lb

## 2011-05-11 DIAGNOSIS — J069 Acute upper respiratory infection, unspecified: Secondary | ICD-10-CM

## 2011-05-11 DIAGNOSIS — H9319 Tinnitus, unspecified ear: Secondary | ICD-10-CM

## 2011-05-11 DIAGNOSIS — K219 Gastro-esophageal reflux disease without esophagitis: Secondary | ICD-10-CM

## 2011-05-11 DIAGNOSIS — R42 Dizziness and giddiness: Secondary | ICD-10-CM | POA: Insufficient documentation

## 2011-05-11 MED ORDER — MECLIZINE HCL 25 MG PO TABS: ORAL_TABLET | ORAL | Status: DC

## 2011-05-11 NOTE — Patient Instructions (Signed)
Tinnitus Sounds you hear in your ears and coming from within the ear is called tinnitus. This can be a symptom of many ear disorders. It is often associated with hearing loss.  Tinnitus can be seen with:  Infections.   Ear blockages such as wax buildup.   Meniere's disease.   Ear damage.   Inherited.   Occupational causes.  While irritating, it is not usually a threat to health. When the cause of the tinnitus is wax, infection in the middle ear, or foreign body it is easily treated. Hearing loss will usually be reversible.  TREATMENT  When treating the underlying cause does not get rid of tinnitus, it may be necessary to get rid of the unwanted sound by covering it up with more pleasant background noises. This may include music, the radio etc. There are tinnitus maskers which can be worn which produce background noise to cover up the tinnitus. Avoid all medications which tend to make tinnitus worse such as alcohol, caffeine, aspirin, and nicotine. There are many soothing background tapes such as rain, ocean, thunderstorms, etc. These soothing sounds help with sleeping or resting. Keep all follow-up appointments and referrals. This is important to identify the cause of the problem. It also helps avoid complications, impaired hearing, disability, or chronic pain. Document Released: 04/20/2005 Document Revised: 12/31/2010 Document Reviewed: 12/07/2007 ExitCare Patient Information 2012 [employer]. 

## 2011-05-11 NOTE — Progress Notes (Signed)
Subjective:   HPI  Ryan Russell is a 31 y.o. male who presents with multiple c/o.   He reports dizzy spells, past few nights with sharp pain in chest, thinks his acid reflux is flaring up.  He went to a event recently and DJ turned music up loud, and he has had ear pain on both sides recently.  He does note sneezing daily, occasional cough, no sore throat, but has been hacking up some mucous.  Has felt feverish.   Using nothing for symptoms but his daily Dexilant.  He does note ongoing long term hx/o tinnitus.  He does play drums, is around loud music at times.  He wears ear plugs on the job, works in [employer].  Has hx/ concussion 2011, not sure if the ongoing ringing in ears and dizziness is from the concussion.  No other c/o.  The following portions of the patient's history were reviewed and updated as appropriate: allergies, current medications, past family history, past medical history, past social history, past surgical history and problem list.  Past Medical History  Diagnosis Date  . Allergy   . GERD (gastroesophageal reflux disease)   . Leukopenia   . Anxiety     intermittent    No Known Allergies  No current outpatient prescriptions on file prior to visit.     Review of Systems Constitutional: denies chills, sweats, unexpected weight change, +fatigue ENT: +runny nose, but no ear pain, sore throat Cardiology: denies chest pain, palpitations, edema Respiratory: denies cough, shortness of breath, wheezing Gastroenterology: +heartburn and occasional abdominal pain, no nausea, vomiting, diarrhea, constipation  Ophthalmology: denies vision changes Urology: denies dysuria, difficulty urinating, hematuria, urinary frequency, urgency Neurology: no headache, weakness, tingling, numbness      Objective:   Physical Exam  General appearance: alert, no distress, WD/WN HEENT: normocephalic, sclerae anicteric, TMs pearly, nares patent, no discharge or erythema, pharynx  normal Oral cavity: MMM, no lesions Neck: supple, no lymphadenopathy, no thyromegaly, no masses, no bruits Heart: RRR, normal S1, S2, no murmurs Lungs: CTA bilaterally, no wheezes, rhonchi, or rales Abdomen: +bs, soft, non tender, non distended, no masses, no hepatomegaly, no splenomegaly Pulses: 2+ symmetric, upper and lower extremities, normal cap refill Neuro: CN2-12 intact, nonfocal exam   Assessment and Plan :     Encounter Diagnoses  Name Primary?  . Dizziness Yes  . Tinnitus   . URI (upper respiratory infection)   . GERD (gastroesophageal reflux disease)     Dizziness - possibly multifactorial.  Orthostatic vitals unremarkable.  He has had hypoglycemia in the past, does not drink enough water in general, and has had some recent URI symptoms.  Script for Meclizine today, rest, increased water intake, avoid hypoglycemia, and call/return if worse or not improving.  Tinnitus - hearing screen normal today.  Of note, he plays drums, is around loud music.   Discussed prevention, ear plugs, and discussed dangers of permanent hearing loss with loud noise exposure. We will refer to ENT for further eval of longstanding tinnitus.   URI - discussed supportive care. Call/return if worse and not improving.  GERD - may supplement with TUMS when worse, avoid trigger foods, c/t Dexilant.

## 2011-07-06 ENCOUNTER — Encounter: Payer: Self-pay | Admitting: Medical

## 2011-07-06 ENCOUNTER — Ambulatory Visit (INDEPENDENT_AMBULATORY_CARE_PROVIDER_SITE_OTHER): Payer: BC Managed Care – PPO | Admitting: Medical

## 2011-07-06 DIAGNOSIS — K644 Residual hemorrhoidal skin tags: Secondary | ICD-10-CM

## 2011-07-06 DIAGNOSIS — T148XXA Other injury of unspecified body region, initial encounter: Secondary | ICD-10-CM

## 2011-07-06 DIAGNOSIS — L988 Other specified disorders of the skin and subcutaneous tissue: Secondary | ICD-10-CM

## 2011-07-06 MED ORDER — HYDROCORTISONE 2.5 % RE CREA: TOPICAL_CREAM | Freq: Two times a day (BID) | RECTAL | Status: AC

## 2011-07-06 NOTE — Progress Notes (Signed)
Subjective:   HPI  LELAND RAVER is a 31 y.o. male who presents for finger issues and bump on his buttock.  He pinched his finger with microphone the other day, has purplish bump that is tender on right index finger.   He also has bump at his anus, not painful, but new, no prior similar.  He does not drink a lot of water in general.  No other aggravating or relieving factors.    No other c/o.  The following portions of the patient's history were reviewed and updated as appropriate: allergies, current medications, past family history, past medical history, past social history, past surgical history and problem list.  Past Medical History  Diagnosis Date  . Allergy   . GERD (gastroesophageal reflux disease)   . Leukopenia   . Anxiety     intermittent    No Known Allergies   Review of Systems ROS reviewed and was negative other than noted in HPI or above.    Objective:   Physical Exam  General appearance: alert, no distress, WD/WN MSK: right index finger distal volar phalanx with 3mm x 2mm oval purplish flat lesion c/w blood blister, finger nontender Rectal: anus with 3mm raised fleshy lump nontender c/w external hemorrhoid  Assessment and Plan :     Encounter Diagnoses  Name Primary?  . Hemorrhoids, external Yes  . Blood blister    Hemorrhoid - Anusol cream, discussed prevention, handout given, call if worsening  Blood blister - reassured, will resolve on its own

## 2011-07-06 NOTE — Patient Instructions (Signed)
Hemorrhoids Hemorrhoids are enlarged (dilated) veins around the rectum. There are 2 types of hemorrhoids, and the type of hemorrhoid is determined by its location. Internal hemorrhoids occur in the veins just inside the rectum.They are usually not painful, but they may bleed.However, they may poke through to the outside and become irritated and painful. External hemorrhoids involve the veins outside the anus and can be felt as a painful swelling or hard lump near the anus.They are often itchy and may crack and bleed. Sometimes clots will form in the veins. This makes them swollen and painful. These are called thrombosed hemorrhoids. CAUSES Causes of hemorrhoids include:  Pregnancy. This increases the pressure in the hemorrhoidal veins.   Constipation.   Straining to have a bowel movement.   Obesity.   Heavy lifting or other activity that caused you to strain.   Prevention!  Eat more fiber in general - whole grain breads, fruits such as apples and pears, green leafy vegetables  Drink lots more water in general  consider OTC fiber supplement daily such as Miralax or Fibercon,etc  If difficult bowel movement, you can use an occasional OTC stool softener such as Colace  TREATMENT Most of the time hemorrhoids improve in 1 to 2 weeks. However, if symptoms do not seem to be getting better or if you have a lot of rectal bleeding, your caregiver may perform a procedure to help make the hemorrhoids get smaller or remove them completely.Possible treatments include:  Rubber band ligation. A rubber band is placed at the base of the hemorrhoid to cut off the circulation.   Sclerotherapy. A chemical is injected to shrink the hemorrhoid.   Infrared light therapy. Tools are used to burn the hemorrhoid.   Hemorrhoidectomy. This is surgical removal of the hemorrhoid.  HOME CARE INSTRUCTIONS   Increase fiber in your diet. Ask your caregiver about using fiber supplements.   Drink enough water  and fluids to keep your urine clear or pale yellow.   Exercise regularly.   Go to the bathroom when you have the urge to have a bowel movement. Do not wait.   Avoid straining to have bowel movements.   Keep the anal area dry and clean.   Only take over-the-counter or prescription medicines for pain, discomfort, or fever as directed by your caregiver.  If your hemorrhoids are thrombosed:  Take warm sitz baths for 20 to 30 minutes, 3 to 4 times per day.   If the hemorrhoids are very tender and swollen, place ice packs on the area as tolerated. Using ice packs between sitz baths may be helpful. Fill a plastic bag with ice. Place a towel between the bag of ice and your skin.   Medicated creams and suppositories may be used or applied as directed.   Do not use a donut-shaped pillow or sit on the toilet for long periods. This increases blood pooling and pain.  SEEK MEDICAL CARE IF:   You have increasing pain and swelling that is not controlled with your medicine.   You have uncontrolled bleeding.   You have difficulty or you are unable to have a bowel movement.   You have pain or inflammation outside the area of the hemorrhoids.   You have chills or an oral temperature above 102 F (38.9 C).  MAKE SURE YOU:   Understand these instructions.   Will watch your condition.   Will get help right away if you are not doing well or get worse.  Document Released: 04/17/2000 Document  Revised: 04/09/2011 Document Reviewed: 08/23/2007 [facility] Patient Information 2012 [place], [place].

## 2011-09-18 ENCOUNTER — Telehealth: Payer: Self-pay | Admitting: Family Medicine

## 2011-09-18 MED ORDER — DEXLANSOPRAZOLE 30 MG PO CPDR: 30.0000 mg | DELAYED_RELEASE_CAPSULE | Freq: Every day | ORAL | Status: DC

## 2011-09-18 NOTE — Telephone Encounter (Signed)
Sent med in 

## 2011-09-18 NOTE — Telephone Encounter (Signed)
Let him know that we don't have any and tell him you can call in a prescription to

## 2011-09-18 NOTE — Telephone Encounter (Signed)
Med called in

## 2011-09-21 ENCOUNTER — Ambulatory Visit: Payer: BC Managed Care – PPO | Admitting: Family Medicine

## 2011-09-22 ENCOUNTER — Other Ambulatory Visit: Payer: Self-pay | Admitting: Internal Medicine

## 2011-09-22 ENCOUNTER — Ambulatory Visit (INDEPENDENT_AMBULATORY_CARE_PROVIDER_SITE_OTHER): Payer: BC Managed Care – PPO | Admitting: Family Medicine

## 2011-09-22 ENCOUNTER — Encounter: Payer: Self-pay | Admitting: Family Medicine

## 2011-09-22 VITALS — BP 114/70 | HR 73 | Wt 149.0 lb

## 2011-09-22 DIAGNOSIS — M549 Dorsalgia, unspecified: Secondary | ICD-10-CM

## 2011-09-22 DIAGNOSIS — K219 Gastro-esophageal reflux disease without esophagitis: Secondary | ICD-10-CM

## 2011-09-22 DIAGNOSIS — L299 Pruritus, unspecified: Secondary | ICD-10-CM

## 2011-09-22 MED ORDER — DEXLANSOPRAZOLE 60 MG PO CPDR: 60.0000 mg | DELAYED_RELEASE_CAPSULE | Freq: Every day | ORAL | Status: DC

## 2011-09-22 NOTE — Telephone Encounter (Signed)
Vernona Rieger phoned in [id number]  #[phone] with 5 refills.Marland Kitchen  ALSO--Prior Authorization was approved from 09/01/11-03/20/12.Marland Kitchen

## 2011-09-22 NOTE — Progress Notes (Signed)
  Subjective:    Patient ID: Ryan [place], male    DOB: 02/06/1981, 31 y.o.   MRN: [id number]  HPI He is here for consultation. He has had intermittent reflux symptoms. He states that Dexilant has been the most successful in helping him with these. He has concerns over being off of it for 3 weeks and potential harm from this. He also continues to have intermittent back pain however upon further questioning it is very difficult to get a good history from him. His apparently does intermittently interfere with work. He states that he feels weak when he lifts things at work. He has been through physical therapy but says that he has been too lazy to do the rehabilitation that he was caught. He then when on to describe intermittent itching feeling as if animals were under his skin. This again tends to come and go.  Review of Systems     Objective:   Physical Exam Alert and in no distress. Exam of his arms shows no lesions. Good motion of his back. No tenderness to palpation.      Assessment & Plan:   1. GERD (gastroesophageal reflux disease)    2. Back pain  Ambulatory referral to Physical Therapy  3. Pruritus     his Dexilant was renewed. I will send him to physical therapy to teach him again proper back care. I reassured him that I found nothing to indicate any parasite infection.Marland Kitchen

## 2011-10-30 ENCOUNTER — Encounter: Payer: Self-pay | Admitting: Medical

## 2011-10-30 ENCOUNTER — Ambulatory Visit (INDEPENDENT_AMBULATORY_CARE_PROVIDER_SITE_OTHER): Payer: BC Managed Care – PPO | Admitting: Medical

## 2011-10-30 VITALS — BP 110/80 | HR 60 | Temp 98.1°F | Resp 16 | Wt 151.5 lb

## 2011-10-30 DIAGNOSIS — R21 Rash and other nonspecific skin eruption: Secondary | ICD-10-CM

## 2011-10-30 DIAGNOSIS — M79603 Pain in arm, unspecified: Secondary | ICD-10-CM

## 2011-10-30 DIAGNOSIS — M79609 Pain in unspecified limb: Secondary | ICD-10-CM

## 2011-10-30 NOTE — Progress Notes (Signed)
Subjective: Here for arm c/o.  A few weeks ago started having pain in left arm when he awoke.  Thinks he may have been working hard lifting things the day before.  The pain continued for a week or two but has settled down.  Shortly after the pain began he had a bad rash on the left arm.  The rash was red and itchy.   He ended up using a home remedy of rubbing alcohol, aspirin and banana and the rash went away.  He is here today to check on the arm and rash.   Objective: Gen: wd, wn, nad Skin: no arm rash, no abnormal skin change of left arm MSK: bilat UE normal ROM, nontender, no obvious abnormality, normal exam Pulses: normal UE pulses  Assessment: Encounter Diagnoses  Name Primary?  . Rash Yes  . Arm pain    Plan: For itching and rash, can use Benadryl or Claritin.  Otherwise exam normal today.  The pain resolved, thus no other recommendation today other than return prn.

## 2012-01-18 ENCOUNTER — Encounter: Payer: Self-pay | Admitting: Family Medicine

## 2012-01-18 ENCOUNTER — Ambulatory Visit (INDEPENDENT_AMBULATORY_CARE_PROVIDER_SITE_OTHER): Payer: BC Managed Care – PPO | Admitting: Family Medicine

## 2012-01-18 VITALS — BP 120/70 | HR 59 | Wt 150.0 lb

## 2012-01-18 DIAGNOSIS — R4789 Other speech disturbances: Secondary | ICD-10-CM

## 2012-01-18 DIAGNOSIS — R4702 Dysphasia: Secondary | ICD-10-CM

## 2012-01-18 NOTE — Progress Notes (Signed)
  Subjective:    Patient ID: Ryan [place], male    DOB: Jun 16, 1980, 31 y.o.   MRN: [id number]  HPI 5 days ago he was eating [place] he ate a tortilla chip. He feels as if it got stuck. Since then he has had intermittent symptoms like that. His had no difficulty with swallowing since then.   Review of Systems     Objective:   Physical Exam Alert and in no distress. Throat is clear. Neck is supple without adenopathy.       Assessment & Plan:  The sensation of a foreign body was discussed with him. Explained that since he was able to drink solids and liquids, symptoms most likely from a small abrasion it should slowly heal. At the end of the encounter he states that he missed Friday his work and would like to know. Explained that I cannot give him a note for being out of work.

## 2012-01-29 ENCOUNTER — Encounter (HOSPITAL_COMMUNITY): Payer: Self-pay | Admitting: *Deleted

## 2012-01-29 ENCOUNTER — Emergency Department (HOSPITAL_COMMUNITY)
Admission: EM | Admit: 2012-01-29 | Discharge: 2012-01-29 | Disposition: A | Discharge status: Home / Self Care | Payer: BC Managed Care – PPO | Source: Home / Self Care

## 2012-01-29 DIAGNOSIS — R6889 Other general symptoms and signs: Secondary | ICD-10-CM

## 2012-01-29 DIAGNOSIS — R0989 Other specified symptoms and signs involving the circulatory and respiratory systems: Secondary | ICD-10-CM

## 2012-01-29 NOTE — ED Provider Notes (Signed)
Medical screening examination/treatment/procedure(s) were performed by non-physician practitioner and as supervising physician I was immediately available for consultation/collaboration.  Leslee Home, M.D.   Reuben Likes, MD 01/29/12 [phone]

## 2012-01-29 NOTE — ED Provider Notes (Signed)
History     CSN: [id number]  Arrival date & time 01/29/12  1911   None     Chief Complaint  Patient presents with  . Foreign Body    (Consider location/radiation/quality/duration/timing/severity/associated sxs/prior treatment) HPI Comments: Approximately 2 weeks ago while playing and instrument this 31 year old male was eating a little too fast. It felt like some of the food that got stuck in his throat he had a moment of choking episode didn't resolve. After which he felt as though he may have a foreign body in the throat. He was seen by a physician it was hypothesized that he may have had a small abrasion in throat which gave him a sensation of a foreign body. He has been able to eat since that time and he has had no airway problems. This afternoon he was eating, had a similar episode while eating he coughed up some matter into the toilet he saw what he thought was a bug. The foreign body sensation which she experienced 2 weeks prior to that reoccurred. He has no oropharyngeal swelling, no airway obstruction no stridor or shortness of breath. He is able to swallow and states on occasion he feels some difficulty with swallowing. His face is clear he is not clearing his throat or coughing.  Patient is a 31 y.o. male presenting with foreign body.  Foreign Body  Pertinent negatives include no fever, no congestion, no sore throat and no trouble swallowing.    Past Medical History  Diagnosis Date  . Allergy   . GERD (gastroesophageal reflux disease)   . Leukopenia   . Anxiety     intermittent    History reviewed. No pertinent past surgical history.  No family history on file.  History  Substance Use Topics  . Smoking status: Passive Smoke Exposure - Never Smoker  . Smokeless tobacco: Never Used  . Alcohol Use: Yes      Review of Systems  Constitutional: Negative for fever, activity change and appetite change.  HENT: Negative for congestion, sore throat, facial swelling,  rhinorrhea, mouth sores, trouble swallowing, neck pain, neck stiffness, voice change and postnasal drip.   Respiratory: Negative.   Gastrointestinal:       Reflux symptoms  Skin: Negative.   Neurological: Negative.     Allergies  Review of patient's allergies indicates no known allergies.  Home Medications   Current Outpatient Rx  Name Route Sig Dispense Refill  . DEXLANSOPRAZOLE 60 MG PO CPDR Oral Take 1 capsule (60 mg total) by mouth daily. 30 capsule 5  . OMEPRAZOLE 10 MG PO CPDR Oral Take 10 mg by mouth daily.      BP 114/73  Pulse 50  Temp 98.2 F (36.8 C) (Oral)  Resp 16  SpO2 100%  Physical Exam  Constitutional: He is oriented to person, place, and time. He appears well-developed and well-nourished. No distress.  HENT:  Head: Normocephalic and atraumatic.  Eyes: Conjunctivae normal and EOM are normal.  Neck: Normal range of motion. Neck supple. No tracheal deviation present. No thyromegaly present.  Cardiovascular: Normal rate and regular rhythm.   Pulmonary/Chest: Effort normal and breath sounds normal. No respiratory distress. He has no wheezes. He has no rales.  Musculoskeletal: Normal range of motion. He exhibits no edema and no tenderness.  Lymphadenopathy:    He has no cervical adenopathy.  Neurological: He is alert and oriented to person, place, and time. No cranial nerve deficit.  Skin: Skin is warm and dry.    ED  Course  Procedures (including critical care time)  Labs Reviewed - No data to display No results found.   1. Foreign body sensation in throat       MDM  Reassurance Consider small nonobstructing foreign body in the esophagus. Consider abrasion. Consider  acid reflux irritation. Coupon with your doctor on Monday he may want to refer to ENT for possible NP scope        Hayden Rasmussen, NP 01/29/12 2102  Hayden Rasmussen, NP 01/29/12 2104

## 2012-01-29 NOTE — ED Notes (Signed)
Patient states he was eating approx 3-4 hrs ago, and "it felt funny in my throat, like it was having a hard time going down"; pt coughed up/spit up to help alleviate sensation and noticed an unidentified insect.  Patient has photo on cell phone of insect.  Denies any other c/o's.

## 2012-02-01 ENCOUNTER — Encounter: Payer: Self-pay | Admitting: Medical

## 2012-02-01 ENCOUNTER — Ambulatory Visit (INDEPENDENT_AMBULATORY_CARE_PROVIDER_SITE_OTHER): Payer: BC Managed Care – PPO | Admitting: Medical

## 2012-02-01 VITALS — BP 100/80 | HR 76 | Temp 98.2°F | Resp 16 | Wt 150.0 lb

## 2012-02-01 DIAGNOSIS — H9313 Tinnitus, bilateral: Secondary | ICD-10-CM

## 2012-02-01 DIAGNOSIS — H9319 Tinnitus, unspecified ear: Secondary | ICD-10-CM

## 2012-02-01 DIAGNOSIS — R1310 Dysphagia, unspecified: Secondary | ICD-10-CM

## 2012-02-01 DIAGNOSIS — J029 Acute pharyngitis, unspecified: Secondary | ICD-10-CM

## 2012-02-01 MED ORDER — CETIRIZINE HCL 10 MG PO TABS: 10.0000 mg | ORAL_TABLET | Freq: Every day | ORAL | Status: DC

## 2012-02-01 MED ORDER — MAGIC MOUTHWASH W/LIDOCAINE: 10.0000 mL | Freq: Three times a day (TID) | ORAL | Status: DC

## 2012-02-01 MED ORDER — OMEPRAZOLE 20 MG PO CPDR: 20.0000 mg | DELAYED_RELEASE_CAPSULE | Freq: Every day | ORAL | Status: DC

## 2012-02-01 NOTE — Progress Notes (Signed)
Subjective: Here for sore throat and concern for scratch in his esophagus.  He was seen here recently after swallowing a tortilla chip that went down wrong.  Still having irritated throat intermittent, scratchy throat, worried about tear, wonders if he should have camera run.   He denies hematemesis, no fever, but does have some runny nose and sneezing.  Denies sinus pressure, cough, dyspnea, wheezing, SOB or chest pain.    He notes long term hx/o ringing in his ears.   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   He has been around loud audio equipment on many occasions but wears ear plugs.   No recent change, just wants this checked out.   Has seen ENT in remote past.    Past Medical History  Diagnosis Date  . Allergy   . GERD (gastroesophageal reflux disease)   . Leukopenia   . Anxiety     intermittent   ROS as noted in HPI    Objective:   Physical Exam  Filed Vitals:   02/01/12 1026  BP: 100/80  Pulse: 76  Temp: 98.2 F (36.8 C)  Resp: 16    General appearance: alert, no distress, WD/WN HEENT: normocephalic, sclerae anicteric, TMs pearly, nares patent, no discharge or erythema, pharynx normal Oral cavity: MMM, no lesions Neck: supple, no lymphadenopathy, no thyromegaly, no masses Heart: RRR, normal S1, S2, no murmurs Lungs: CTA bilaterally, no wheezes, rhonchi, or rales Abdomen: +bs, soft, non tender, non distended, no masses, no hepatomegaly, no splenomegaly  Assessment and Plan :    Encounter Diagnoses  Name Primary?  Marland Kitchen Dysphagia Yes  . Sore throat   . Tinnitus of both ears    Dysphagia and sore throat - seems low risk, reassured that symptoms don't suggest anything major.   Will begin 2-4 trial of Prilosec.  Samples given. Also script for magic mouthwash.  Avoid hard large foods such as nuts, chips, and recheck if not resolving.  Discussed signs of GI bleed or infection.  Call report 67mo.  Tinnitus - hearing screen normal.  Begin OTC Zyrtec  QHS, and if not improving recheck.    Call report 67mo

## 2012-05-04 DIAGNOSIS — H9190 Unspecified hearing loss, unspecified ear: Secondary | ICD-10-CM

## 2012-05-04 HISTORY — DX: Unspecified hearing loss, unspecified ear: H91.90

## 2012-06-02 ENCOUNTER — Encounter: Payer: Self-pay | Admitting: Family Medicine

## 2012-06-02 ENCOUNTER — Ambulatory Visit (INDEPENDENT_AMBULATORY_CARE_PROVIDER_SITE_OTHER): Payer: BC Managed Care – PPO | Admitting: Family Medicine

## 2012-06-02 VITALS — BP 120/78 | HR 80 | Wt 156.0 lb

## 2012-06-02 DIAGNOSIS — B9789 Other viral agents as the cause of diseases classified elsewhere: Secondary | ICD-10-CM

## 2012-06-02 DIAGNOSIS — B349 Viral infection, unspecified: Secondary | ICD-10-CM

## 2012-06-02 NOTE — Progress Notes (Signed)
  Subjective:    Patient ID: Ryan [place], male    DOB: Sep 08, 1980, 32 y.o.   MRN: [id number]  HPI He has a several month history of intermittent redness he notices on his chest back and last for several hours. He did feel warm but they do not itch burn or sting. He also complains of a two-week history of fatigue, intermittent fever, arthralgias as well as abdominal pain but no diarrhea or vomiting. He then when on to complain of various other very vague symptoms and concerns. It was very difficult to get him to stay on task and asked one particular question.  Review of Systems     Objective:   Physical Exam alert and in no distress. Tympanic membranes and canals are normal. Throat is clear. Tonsils are normal. Neck is supple without adenopathy or thyromegaly. Cardiac exam shows a regular sinus rhythm without murmurs or gallops. Lungs are clear to auscultation. Abdominal exam shows no masses or tenderness.       Assessment & Plan:   1. Viral syndrome    I reassured him that I found nothing of any significance. Recommended supportive care. Did suggest that if he notes a rash and it is during office hours, to return here for evaluation.

## 2012-07-08 ENCOUNTER — Ambulatory Visit: Payer: BC Managed Care – PPO | Admitting: Medical

## 2012-07-11 ENCOUNTER — Ambulatory Visit (INDEPENDENT_AMBULATORY_CARE_PROVIDER_SITE_OTHER): Payer: BC Managed Care – PPO | Admitting: Medical

## 2012-07-11 ENCOUNTER — Encounter: Payer: Self-pay | Admitting: Medical

## 2012-07-11 VITALS — BP 100/80 | HR 62 | Temp 97.9°F | Resp 16 | Wt 157.0 lb

## 2012-07-11 DIAGNOSIS — G43909 Migraine, unspecified, not intractable, without status migrainosus: Secondary | ICD-10-CM

## 2012-07-11 DIAGNOSIS — J019 Acute sinusitis, unspecified: Secondary | ICD-10-CM

## 2012-07-11 MED ORDER — ELETRIPTAN HYDROBROMIDE 40 MG PO TABS: 40.0000 mg | ORAL_TABLET | ORAL | Status: DC | PRN

## 2012-07-11 MED ORDER — AMOXICILLIN 875 MG PO TABS: 875.0000 mg | ORAL_TABLET | Freq: Two times a day (BID) | ORAL | Status: DC

## 2012-07-11 NOTE — Progress Notes (Signed)
Subjective: Here for c/o headaches.  He notes headaches daily for the past few weeks.  Last headache was yesterday, 1pm, in church, felt in coming on.  Been getting headaches somewhat frequency.   Mostly these one sided, right, gets pressure behind the eye.  Starts as soreness in eye, right side of face/head, eye feels heavy.  Bright light makes headache worse, has some associated nausea too.  Denies eyes watering, but no running nose.   Headache pain is throbbing, no sharp pains.   Cheek will start bothering him.  He wonders if this is vision problem, stress, or tooth problem.  Teeth hurt at time on the right too.   Relieved headaches with sleep in dark quiet room.  Has had a few headaches while at work too.   Thinks a lot of this is due to stress.   Had 2 funerals a few weeks ago, thus, has had loss of friend and family member recently.  Denies allergy or sinus problems.   He does skip meals at times.   Denies much caffeine at all.  Uses some herbal life meal replacements at times.  Been having problems for 3 wk with headaches.   Had similar about age 32yo. Those headaches 10 years ago were stress related.     Past Medical History  Diagnosis Date  . Allergy   . GERD (gastroesophageal reflux disease)   . Leukopenia   . Anxiety     intermittent   ROS as in subjective    Objective:   Physical Exam  Filed Vitals:   07/11/12 0940  BP: 100/80  Pulse: 62  Temp: 97.9 F (36.6 C)  Resp: 16    General appearance: alert, no distress, WD/WN, AA male HEENT: normocephalic, sclerae anicteric, PERRLA, EOMi, thick mucous in right nare, otherwise left nare patent, tender right maxillary sinus, no discharge or erythema, pharynx normal Oral cavity: MMM, no lesions Neck: supple, no lymphadenopathy, no thyromegaly, no masses, no bruits Heart: RRR, normal S1, S2, no murmurs Lungs: CTA bilaterally, no wheezes, rhonchi, or rales Back: non tender Musculoskeletal: nontender Extremities: no edema, no  cyanosis, no clubbing Pulses: 2+ symmetric, upper and lower extremities, normal cap refill Neurological: alert, oriented x 3, CN2-12 intact, strength normal upper extremities and lower extremities, sensation normal throughout, DTRs 2+ throughout, no cerebellar signs, gait normal Psychiatric: normal affect, behavior normal, pleasant    Assessment and Plan :    Encounter Diagnoses  Name Primary?  . Acute sinusitis Yes  . Migraine    Begin amoxicillin, nasal saline flush, hydration.  Discussed migraines symptoms, diagnosis, treatment, stress reduction, use of Relpax, samples given.  Discussed proper use of Relpax,risks/benefits.  F/u in 2-3 wk if headaches continue.   Follow-up prn, note given for work

## 2012-07-11 NOTE — Patient Instructions (Signed)
Begin Amoxicillin twice daily for 10 days to treat the sinus infection.   For acute migraine, as soon as you feel the same worsening symptoms - nausea, worse with bight light, 1 sided headache, throbbing pain, eye pressure, - then begin 1 tablet of Relpax.   This is to treat the migraine.   If not improved in 2 hours, you may repeat Relpax once.  No more than 2 Relpax in a single day.   Other options for acute headache include short term, not daily, Ibuprofen or Excedrin.  If headaches persist, then recheck.      Migraine Headache A migraine headache is an intense, throbbing pain on one or both sides of your head. A migraine can last for 30 minutes to several hours. CAUSES  The exact cause of a migraine headache is not always known. However, a migraine may be caused when nerves in the brain become irritated and release chemicals that cause inflammation. This causes pain. SYMPTOMS  Pain on one or both sides of your head.  Pulsating or throbbing pain.  Severe pain that prevents daily activities.  Pain that is aggravated by any physical activity.  Nausea, vomiting, or both.  Dizziness.  Pain with exposure to bright lights, loud noises, or activity.  General sensitivity to bright lights, loud noises, or smells. Before you get a migraine, you may get warning signs that a migraine is coming (aura). An aura may include:  Seeing flashing lights.  Seeing bright spots, halos, or zig-zag lines.  Having tunnel vision or blurred vision.  Having feelings of numbness or tingling.  Having trouble talking.  Having muscle weakness. MIGRAINE TRIGGERS  Alcohol.  Smoking.  Stress.  Menstruation.  Aged cheeses.  Foods or drinks that contain nitrates, glutamate, aspartame, or tyramine.  Lack of sleep.  Chocolate.  Caffeine.  Hunger.  Physical exertion.  Fatigue.  Medicines used to treat chest pain (nitroglycerine), birth control pills, estrogen, and some blood pressure  medicines. DIAGNOSIS  A migraine headache is often diagnosed based on:  Symptoms.  Physical examination.  A CT scan or MRI of your head. TREATMENT Medicines may be given for pain and nausea. Medicines can also be given to help prevent recurrent migraines.  HOME CARE INSTRUCTIONS  Only take over-the-counter or prescription medicines for pain or discomfort as directed by your caregiver. The use of long-term narcotics is not recommended.  Lie down in a dark, quiet room when you have a migraine.  Keep a journal to find out what may trigger your migraine headaches. For example, write down:  What you eat and drink.  How much sleep you get.  Any change to your diet or medicines.  Limit alcohol consumption.  Quit smoking if you smoke.  Get 7 to 9 hours of sleep, or as recommended by your caregiver.  Limit stress.  Keep lights dim if bright lights bother you and make your migraines worse. SEEK IMMEDIATE MEDICAL CARE IF:   Your migraine becomes severe.  You have a fever.  You have a stiff neck.  You have vision loss.  You have muscular weakness or loss of muscle control.  You start losing your balance or have trouble walking.  You feel faint or pass out.  You have severe symptoms that are different from your first symptoms. MAKE SURE YOU:   Understand these instructions.  Will watch your condition.  Will get help right away if you are not doing well or get worse. Document Released: 04/20/2005 Document Revised: 07/13/2011 Document  Reviewed: 04/10/2011 [facility] Patient Information 2013 [place], [place].

## 2012-07-12 ENCOUNTER — Encounter: Payer: Self-pay | Admitting: Internal Medicine

## 2012-07-12 ENCOUNTER — Telehealth: Payer: Self-pay | Admitting: Medical

## 2012-07-12 NOTE — Telephone Encounter (Signed)
I ok'd note to [place]

## 2012-07-13 NOTE — Telephone Encounter (Signed)
Done note yesterday and pt notified

## 2012-09-05 ENCOUNTER — Encounter: Payer: Self-pay | Admitting: Medical

## 2012-09-05 ENCOUNTER — Ambulatory Visit (INDEPENDENT_AMBULATORY_CARE_PROVIDER_SITE_OTHER): Payer: BC Managed Care – PPO | Admitting: Medical

## 2012-09-05 VITALS — BP 98/70 | HR 60 | Temp 98.0°F | Resp 16 | Wt 153.0 lb

## 2012-09-05 DIAGNOSIS — M79609 Pain in unspecified limb: Secondary | ICD-10-CM

## 2012-09-05 DIAGNOSIS — M79601 Pain in right arm: Secondary | ICD-10-CM

## 2012-09-05 DIAGNOSIS — R071 Chest pain on breathing: Secondary | ICD-10-CM

## 2012-09-05 DIAGNOSIS — R0789 Other chest pain: Secondary | ICD-10-CM

## 2012-09-05 NOTE — Progress Notes (Signed)
Subjective: Here for 1.5 wk hx/o right arm discomfort.   Was lifting box at work, something was leaking out of the box.  At times itching of the arm, at times weak feeling in the arm.  Wanted to have this looked at.   He notes that employer policy is that after 48 hours, he has to go to his primary doctor but he wasn't sure.  Still gets occasional discomfort in left chest.  Had holter monitoring and eval in the past, normal.  Thinks this was a few years back.  He has been lifting heavy objects.   Lifts stuff all the time. Gets nausea at times.  Denies SOB, wheezing, numbness, tingling.  Sometimes feels lightheaded.  No palpitations.  Past Medical History  Diagnosis Date  . Allergy   . GERD (gastroesophageal reflux disease)   . Leukopenia   . Anxiety     intermittent   ROS as in subjective   Gen: wd, wn, nad Skin: arm with no rash, no lesions Chest wall: mild tenderness to palpation, but no deformity MSK: right and left arms mildly tender throughout, but ROM normal, no obvious deformity. Neuro: UE strength, DTRs, sensation normal. Heart: RRR, normal S1, S2, no murmurs Lungs: clear  Assessment: Encounter Diagnoses  Name Primary?  . Chest wall pain Yes  . Arm pain, right    Chest wall pain - advised relative rest, heat pad, stretching,OTC NSAID prn.  Etiology most likely due to physical activity.  He had questions about prior cardiac eval.  i reviweed EKG here from 2010 and 2012.   Will request records from prior cardiac eval.  He has no significant symptoms or risks of a cardiac etiology of his pain.  Arm pain - exam noncontributory.   Advised he at least report this to his employer to document the incident in case something turns out to be ongoing problem with the arm.   No obvious rash or deformity currently.

## 2012-09-30 ENCOUNTER — Other Ambulatory Visit: Payer: Self-pay | Admitting: Family Medicine

## 2012-10-11 ENCOUNTER — Ambulatory Visit: Payer: BC Managed Care – PPO | Admitting: Medical

## 2012-10-12 ENCOUNTER — Ambulatory Visit: Payer: BC Managed Care – PPO | Admitting: Medical

## 2012-10-17 ENCOUNTER — Encounter: Payer: Self-pay | Admitting: Internal Medicine

## 2012-10-17 ENCOUNTER — Ambulatory Visit (INDEPENDENT_AMBULATORY_CARE_PROVIDER_SITE_OTHER): Payer: BC Managed Care – PPO | Admitting: Medical

## 2012-10-17 ENCOUNTER — Encounter: Payer: Self-pay | Admitting: Medical

## 2012-10-17 VITALS — BP 110/70 | HR 68 | Temp 98.4°F | Resp 16 | Wt 152.0 lb

## 2012-10-17 DIAGNOSIS — M65839 Other synovitis and tenosynovitis, unspecified forearm: Secondary | ICD-10-CM

## 2012-10-17 DIAGNOSIS — M778 Other enthesopathies, not elsewhere classified: Secondary | ICD-10-CM

## 2012-10-17 MED ORDER — DICLOFENAC SODIUM 75 MG PO TBEC: 75.0000 mg | DELAYED_RELEASE_TABLET | Freq: Two times a day (BID) | ORAL | Status: DC

## 2012-10-17 NOTE — Progress Notes (Signed)
Subjective Here for 3 wk hx/o pain on top of right wrist.  He is right handed.  Worse with activity, repetitive, lifting boxes. Doesn't bother him at rest, but worse with activity.  Pain is sharp, weak with activity, otherwise no numbness, no tingling.  Rest helps.  Has tried epsom salts, rubbing alcohol topically, wrist brace.   Works at [employer], loading/unloading trucks.  No other injury, no trauma, no prior similar with this hand.     Past Medical History  Diagnosis Date  . Allergy   . GERD (gastroesophageal reflux disease)   . Leukopenia   . Anxiety     intermittent   ROS as in subjective  Objective: Gen: wd, wn, nad MSK: tender over dorsal right carpal bones, mild tenderness 3rd metacarpal mid shaft, otherwise hand, fingers, arms nontender, no swelling, no other deformity, rest of UE unremarkable Neurovascularly intact   Assessment: Encounter Diagnosis  Name Primary?  . Tendonitis of wrist, right Yes   Plan: Short term RICE - rest, ice compression, elevation, NSAIDs as below, note for work x today only, discussed appropriately time for rest and recovery.  F/u prn.

## 2012-10-17 NOTE — Patient Instructions (Addendum)
Short term with this type of pain, use:  Rest  Ice  Compression with your wrist sleeve or bowling glove  Elevation And Diclofenac twice daily as needed for pain and inflammation  Tendinitis Tendinitis is swelling and inflammation of the tendons. Tendons are band-like tissues that connect muscle to bone. Tendinitis commonly occurs in the:   Shoulders (rotator cuff).  Heels (Achilles tendon).  Elbows (triceps tendon). CAUSES Tendinitis is usually caused by overusing the tendon, muscles, and joints involved. When the tissue surrounding a tendon (synovium) becomes inflamed, it is called tenosynovitis. Tendinitis commonly develops in people whose jobs require repetitive motions. SYMPTOMS  Pain.  Tenderness.  Mild swelling. DIAGNOSIS Tendinitis is usually diagnosed by physical exam. Your caregiver may also order X-rays or other imaging tests. TREATMENT Your caregiver may recommend certain medicines or exercises for your treatment. HOME CARE INSTRUCTIONS   Use a sling or splint for as long as directed by your caregiver until the pain decreases.  Put ice on the injured area.  Put ice in a plastic bag.  Place a towel between your skin and the bag.  Leave the ice on for 15-20 minutes, 3-4 times a day.  Avoid using the limb while the tendon is painful. Perform gentle range of motion exercises only as directed by your caregiver. Stop exercises if pain or discomfort increase, unless directed otherwise by your caregiver.  Only take over-the-counter or prescription medicines for pain, discomfort, or fever as directed by your caregiver. SEEK MEDICAL CARE IF:   Your pain and swelling increase.  You develop new, unexplained symptoms, especially increased numbness in the hands. MAKE SURE YOU:   Understand these instructions.  Will watch your condition.  Will get help right away if you are not doing well or get worse. Document Released: 04/17/2000 Document Revised: 07/13/2011  Document Reviewed: 07/07/2010 [facility] Patient Information 2014 [place], [place].

## 2012-10-28 ENCOUNTER — Encounter: Payer: Self-pay | Admitting: Medical

## 2012-10-28 ENCOUNTER — Ambulatory Visit (INDEPENDENT_AMBULATORY_CARE_PROVIDER_SITE_OTHER): Payer: BC Managed Care – PPO | Admitting: Medical

## 2012-10-28 VITALS — BP 100/80 | HR 60 | Temp 97.3°F | Resp 16 | Ht 71.0 in | Wt 155.0 lb

## 2012-10-28 DIAGNOSIS — H9190 Unspecified hearing loss, unspecified ear: Secondary | ICD-10-CM

## 2012-10-28 DIAGNOSIS — L678 Other hair color and hair shaft abnormalities: Secondary | ICD-10-CM

## 2012-10-28 DIAGNOSIS — L738 Other specified follicular disorders: Secondary | ICD-10-CM

## 2012-10-28 DIAGNOSIS — K219 Gastro-esophageal reflux disease without esophagitis: Secondary | ICD-10-CM

## 2012-10-28 DIAGNOSIS — H9193 Unspecified hearing loss, bilateral: Secondary | ICD-10-CM

## 2012-10-28 DIAGNOSIS — Z113 Encounter for screening for infections with a predominantly sexual mode of transmission: Secondary | ICD-10-CM

## 2012-10-28 DIAGNOSIS — Z0000 Encounter for general adult medical examination without abnormal findings: Secondary | ICD-10-CM

## 2012-10-28 DIAGNOSIS — L739 Follicular disorder, unspecified: Secondary | ICD-10-CM

## 2012-10-28 LAB — POCT URINALYSIS DIPSTICK
Bilirubin, UA: NEGATIVE
Glucose, UA: NEGATIVE
Leukocytes, UA: NEGATIVE
Nitrite, UA: NEGATIVE

## 2012-10-28 LAB — COMPREHENSIVE METABOLIC PANEL
ALT: 14 U/L (ref 0–53)
AST: 20 U/L (ref 0–37)
CO2: 29 mEq/L (ref 19–32)
Calcium: 9.7 mg/dL (ref 8.4–10.5)
Chloride: 103 mEq/L (ref 96–112)
Sodium: 136 mEq/L (ref 135–145)
Total Bilirubin: 0.9 mg/dL (ref 0.3–1.2)
Total Protein: 7.2 g/dL (ref 6.0–8.3)

## 2012-10-28 LAB — CBC
Platelets: 232 10*3/uL (ref 150–400)
RBC: 5.53 MIL/uL (ref 4.22–5.81)
WBC: 3.7 10*3/uL — ABNORMAL LOW (ref 4.0–10.5)

## 2012-10-28 LAB — LIPID PANEL
Cholesterol: 140 mg/dL (ref 0–200)
Triglycerides: 45 mg/dL (ref <150)

## 2012-10-28 LAB — TSH: TSH: 0.904 u[IU]/mL (ref 0.350–4.500)

## 2012-10-28 NOTE — Progress Notes (Addendum)
Subjective:   HPI  Ryan Russell is a 32 y.o. male who presents for a complete physical.  Medical care team includes: Audiology - 2014 for eval of hearing  Last ophthalmology visit:yes- Dr. Oswaldo Done 06/2012 Last dental visit:yes- Dr. Lenise Arena  Last colonoscopy:n/a Last prostate exam: n/a Last EKG:01/09/2011 Last labs:2012  Prior vaccinations: TD or Tdap:2010 at a urgent care office Influenza:n/a Pneumococcal:n/a Shingles/Zostavax:n/a  Advanced directive:n/a Health care power of attorney:n/a Living will:n/a  Concerns: Small bump on neck popped up recently.  Has some mild abdominal pain, but no diarrhea, blood in stool, fever.  Reviewed their medical, surgical, family, social, medication, and allergy history and updated chart as appropriate.   Past Medical History  Diagnosis Date  . Allergy   . GERD (gastroesophageal reflux disease)   . Leukopenia     Hematology eval 2012, Dr. Dalene Carrow, felt to be ethnicity related  . Wears glasses   . Hearing loss 2014    mild, high frequency, audiology eval   . History of back pain     prior physical therapy  . Anxiety     intermittent    History reviewed. No pertinent past surgical history.  Family History  Problem Relation Age of Onset  . Cancer Mother     liver  . Cancer Father     ? possibly colon  . Fibromyalgia Sister   . Heart disease Neg Hx   . Diabetes Other   . Hypertension Other     History   Social History  . Marital Status: Single    Spouse Name: N/A    Number of Children: N/A  . Years of Education: N/A   Occupational History  . Not on file.   Social History Main Topics  . Smoking status: Passive Smoke Exposure - Never Smoker  . Smokeless tobacco: Never Used  . Alcohol Use: No  . Drug Use: No  . Sexually Active: Not Currently   Other Topics Concern  . Not on file   Social History Narrative   Lives with family, works at [employer] in [employer], works at airport - loading, Radiation protection practitioner, music, runs [employer],  musician    Current Outpatient Prescriptions on File Prior to Visit  Medication Sig Dispense Refill  . DEXILANT 60 MG capsule TAKE 1 CAPSULE EVERY DAY  30 capsule  4   No current facility-administered medications on file prior to visit.    No Known Allergies   Review of Systems Constitutional: -fever, -chills, -sweats, -unexpected weight change, -decreased appetite, +fatigue Allergy: -sneezing, -itching, -congestion Dermatology: -changing moles, --rash, +lumps(neck x few days) ENT: -runny nose, -ear pain, -sore throat, -hoarseness, -sinus pain, -teeth pain, +ringing in ears, +hearing loss, -nosebleeds Cardiology: -chest pain, -palpitations, -swelling, -difficulty breathing when lying flat, -waking up short of breath Respiratory: -cough, -shortness of breath, -difficulty breathing with exercise or exertion, -wheezing, -coughing up blood Gastroenterology: -abdominal pain, -nausea, -vomiting, -diarrhea, -constipation, -blood in stool, -changes in bowel movement, -difficulty swallowing or eating Hematology: -bleeding, -bruising  Musculoskeletal: -joint aches, -muscle aches, -joint swelling, +back pain, +neck pain, +cramping, -changes in gait Ophthalmology: denies vision changes, eye redness, itching, discharge Urology: -burning with urination, -difficulty urinating, -blood in urine, -urinary frequency, -urgency, -incontinence Neurology: +headache, -weakness, +tingling, +numbness, -memory loss, -falls, -dizziness Psychology: -depressed mood, -agitation, -sleep problems     Objective:   Physical Exam  Nurse notes and vitals reviewed  General appearance: alert, no distress, WD/WN, leaned AA male Skin: no worrisome findings HEENT: normocephalic, conjunctiva/corneas normal, sclerae anicteric,  PERRLA, EOMi, nares patent, no discharge or erythema, pharynx normal Oral cavity: MMM, tongue normal, teeth in good repair Neck: supple, no lymphadenopathy, no thyromegaly, no masses, normal  ROM Chest: non tender, normal shape and expansion Heart: RRR, normal S1, S2, no murmurs Lungs: CTA bilaterally, no wheezes, rhonchi, or rales Abdomen: +bs, soft, mild generalized tenderness, non distended, no masses, no hepatomegaly, no splenomegaly, no bruits Back: non tender, normal ROM, no scoliosis Musculoskeletal: upper extremities non tender, no obvious deformity, normal ROM throughout, lower extremities non tender, no obvious deformity, normal ROM throughout Extremities: no edema, no cyanosis, no clubbing Pulses: 2+ symmetric, upper and lower extremities, normal cap refill Neurological: alert, oriented x 3, CN2-12 intact, strength normal upper extremities and lower extremities, sensation normal throughout, DTRs 2+ throughout, no cerebellar signs, gait normal Psychiatric: normal affect, behavior normal, pleasant  GU: normal male external genitalia, circumcised, nontender, no masses, no hernia, no lymphadenopathy Rectal: deferred   Assessment and Plan :      Encounter Diagnoses  Name Primary?  . Routine general medical examination at a health care facility Yes  . Folliculitis   . GERD (gastroesophageal reflux disease)   . Hearing loss of both ears   . Screen for sexually transmitted diseases     Physical exam - discussed healthy lifestyle, diet, exercise, preventative care, vaccinations, and addressed their concerns.    Folliculitis - warm compresses, call if worsening.  GERD - c/t dexilant, trigger avoidance  Hearing loss per audiology eval earlier this year, advised hearing protection  HIV test today.   Follow-up pending labs

## 2012-10-28 NOTE — Patient Instructions (Signed)
See eye doctor and dentist yearly  Eat healthy, exercise regularly.  Wear hearing protection.  Get flu vaccine in October  Increase your fiber and water intake to help get more regular.   If this doesn't help, we can discussed medication to help with constipation.    Constipation, Adult Constipation is when a person has fewer than 3 bowel movements a week; has difficulty having a bowel movement; or has stools that are dry, hard, or larger than normal. As people grow older, constipation is more common. If you try to fix constipation with medicines that make you have a bowel movement (laxatives), the problem may get worse. Long-term laxative use may cause the muscles of the colon to become weak. A low-fiber diet, not taking in enough fluids, and taking certain medicines may make constipation worse. CAUSES   Certain medicines, such as antidepressants, pain medicine, iron supplements, antacids, and water pills.   Certain diseases, such as diabetes, irritable bowel syndrome (IBS), thyroid disease, or depression.   Not drinking enough water.   Not eating enough fiber-rich foods.   Stress or travel.  Lack of physical activity or exercise.  Not going to the restroom when there is the urge to have a bowel movement.  Ignoring the urge to have a bowel movement.  Using laxatives too much. SYMPTOMS   Having fewer than 3 bowel movements a week.   Straining to have a bowel movement.   Having hard, dry, or larger than normal stools.   Feeling full or bloated.   Pain in the lower abdomen.  Not feeling relief after having a bowel movement. DIAGNOSIS  Your caregiver will take a medical history and perform a physical exam. Further testing may be done for severe constipation. Some tests may include:   A barium enema X-ray to examine your rectum, colon, and sometimes, your small intestine.  A sigmoidoscopy to examine your lower colon.  A colonoscopy to examine your entire  colon. TREATMENT  Treatment will depend on the severity of your constipation and what is causing it. Some dietary treatments include drinking more fluids and eating more fiber-rich foods. Lifestyle treatments may include regular exercise. If these diet and lifestyle recommendations do not help, your caregiver may recommend taking over-the-counter laxative medicines to help you have bowel movements. Prescription medicines may be prescribed if over-the-counter medicines do not work.  HOME CARE INSTRUCTIONS   Increase dietary fiber in your diet, such as fruits, vegetables, whole grains, and beans. Limit high-fat and processed sugars in your diet, such as [place] fries, hamburgers, cookies, candies, and soda.   A fiber supplement may be added to your diet if you cannot get enough fiber from foods.   Drink enough fluids to keep your urine clear or pale yellow.   Exercise regularly or as directed by your caregiver.   Go to the restroom when you have the urge to go. Do not hold it.  Only take medicines as directed by your caregiver. Do not take other medicines for constipation without talking to your caregiver first. SEEK IMMEDIATE MEDICAL CARE IF:   You have bright red blood in your stool.   Your constipation lasts for more than 4 days or gets worse.   You have abdominal or rectal pain.   You have thin, pencil-like stools.  You have unexplained weight loss. MAKE SURE YOU:   Understand these instructions.  Will watch your condition.  Will get help right away if you are not doing well or get worse. Document  Released: 01/17/2004 Document Revised: 07/13/2011 Document Reviewed: 03/24/2011 [facility] Patient Information 2014 [place], [place].

## 2012-10-31 ENCOUNTER — Encounter: Payer: Self-pay | Admitting: Family Medicine

## 2013-03-20 ENCOUNTER — Encounter: Payer: Self-pay | Admitting: Medical

## 2013-03-20 ENCOUNTER — Ambulatory Visit (INDEPENDENT_AMBULATORY_CARE_PROVIDER_SITE_OTHER): Payer: BC Managed Care – PPO | Admitting: Medical

## 2013-03-20 VITALS — BP 110/78 | HR 92 | Temp 97.9°F | Resp 16 | Wt 148.0 lb

## 2013-03-20 DIAGNOSIS — Z733 Stress, not elsewhere classified: Secondary | ICD-10-CM

## 2013-03-20 DIAGNOSIS — Z809 Family history of malignant neoplasm, unspecified: Secondary | ICD-10-CM

## 2013-03-20 DIAGNOSIS — F4320 Adjustment disorder, unspecified: Secondary | ICD-10-CM

## 2013-03-20 DIAGNOSIS — R109 Unspecified abdominal pain: Secondary | ICD-10-CM

## 2013-03-20 DIAGNOSIS — F4321 Adjustment disorder with depressed mood: Secondary | ICD-10-CM

## 2013-03-20 MED ORDER — DEXLANSOPRAZOLE 60 MG PO CPDR: 60.0000 mg | DELAYED_RELEASE_CAPSULE | Freq: Every day | ORAL | Status: DC

## 2013-03-20 NOTE — Progress Notes (Signed)
Subjective:  Ryan Russell is a 32 y.o. male who presents for concerns.  His mother died this passed weekend.  She had cancer in liver, metastasized, was stage IV.  She was ill from June until now.  This was her second time with cancer diagnosis. They were not sure of the primary tumor.  Father diagnosed with prostate cancer at age 37yo.  Paternal uncle and paternal cousin also had prostate cancer.  He is worried about his cancer risks.  He is having some abdominal discomfort.  Has hx/o GERD was on Dexilant prior, but hasn't been using it.  He has some vague central abdominal pain, but no nausea, vomiting, diarrhea, constipation, blood in stool, no fever, no weight loss.  No other c/o.  The following portions of the patient's history were reviewed and updated as appropriate: allergies, current medications, past family history, past medical history, past social history, past surgical history and problem list.  ROS Otherwise as in subjective above  Objective: Physical Exam  BP 110/78  Pulse 92  Temp(Src) 97.9 F (36.6 C) (Oral)  Resp 16  Wt 148 lb (67.132 kg)   General appearance: alert, no distress, WD/WN Neck: supple, no lymphadenopathy, no thyromegaly, no masses Heart: RRR, normal S1, S2, no murmurs Lungs: CTA bilaterally, no wheezes, rhonchi, or rales Abdomen: +bs, soft, non tender, non distended, no masses, no hepatomegaly, no splenomegaly Pulses: 2+ radial pulses, 2+ pedal pulses, normal cap refill GU: normal male external genitalia, circumcised, no mass, no lesions, no hernia, no lymphadenopathy Rectal: anus normal tone, prostate WNL Ext: no edema   Assessment: Encounter Diagnoses  Name Primary?  . Abdominal discomfort Yes  . Bereavement reaction   . Family history of cancer    Plan: Discussed his concerns.  advised he begin back on dexilant.  Expressed my sympathy.   discussed routine cancer screening, discussed screens available, time frame for certain screens such as  colonoscopy.  At this point, prostate feels normal, testicular exam normal, lungs clear, and he is low risk otherwise.   F/u prn.

## 2013-04-03 ENCOUNTER — Ambulatory Visit (INDEPENDENT_AMBULATORY_CARE_PROVIDER_SITE_OTHER): Payer: BC Managed Care – PPO | Admitting: Medical

## 2013-04-03 ENCOUNTER — Encounter: Payer: Self-pay | Admitting: Medical

## 2013-04-03 VITALS — BP 100/60 | HR 68 | Temp 97.4°F | Resp 16 | Wt 152.0 lb

## 2013-04-03 DIAGNOSIS — S61431A Puncture wound without foreign body of right hand, initial encounter: Secondary | ICD-10-CM

## 2013-04-03 DIAGNOSIS — S61409A Unspecified open wound of unspecified hand, initial encounter: Secondary | ICD-10-CM

## 2013-04-03 NOTE — Progress Notes (Signed)
Subjective: Here for puncture wound.  On Thanksgiving day, 5 days ago, he was cleaning out his car.  He reached under the seat and accidentally poked himself on the space between the right thumb and index finger.  There has been no redness, warmth, drainage, fever.  The area is just mildly tender at the surface.  No other aggravating or relieving factors.  No other c/o.  ROS as in subjective  Objective: Gen: wd, wn, nad Skin: right hand on skin between thumb and index finger with small 2mm area of erythema, suggestive of abrasion or the recent reported puncture wound.   There is no warmth, induration, fluctuance, no drainage.   MSK nontender thumb, index finger, hand, no obvious foreign body, ROM of fingers and hand normal Neurovascularly intact hand/arm  Assessment: Encounter Diagnosis  Name Primary?  . Puncture wound of hand, right, initial encounter Yes    Plan: His last Tetanus Booster was in 2010. Discussed findings.  Reassured that currently there is only a small superficial wound . There are no signs of infection, no signs of foreign body.  Advise he keep the area clean with soap and water.  Call or return if any signs of infection or any other problems

## 2013-04-04 ENCOUNTER — Other Ambulatory Visit: Payer: Self-pay | Admitting: Family Medicine

## 2013-07-07 ENCOUNTER — Encounter: Payer: Self-pay | Admitting: Medical

## 2013-07-07 ENCOUNTER — Ambulatory Visit (INDEPENDENT_AMBULATORY_CARE_PROVIDER_SITE_OTHER): Payer: BC Managed Care – PPO | Admitting: Medical

## 2013-07-07 VITALS — BP 100/68 | HR 64 | Temp 97.4°F | Resp 14 | Wt 149.0 lb

## 2013-07-07 DIAGNOSIS — M549 Dorsalgia, unspecified: Secondary | ICD-10-CM

## 2013-07-07 DIAGNOSIS — M62830 Muscle spasm of back: Secondary | ICD-10-CM

## 2013-07-07 DIAGNOSIS — M5380 Other specified dorsopathies, site unspecified: Secondary | ICD-10-CM

## 2013-07-07 MED ORDER — CYCLOBENZAPRINE HCL 10 MG PO TABS: 10.0000 mg | ORAL_TABLET | Freq: Every day | ORAL | Status: DC

## 2013-07-07 NOTE — Patient Instructions (Signed)
Massage Therapy:  Ryan Russell [facility] [address] [place], [place] [address] [phone] [email or web address]   Tiffany Wright [facility] [address] [place], [place] [address] [phone]   

## 2013-07-07 NOTE — Progress Notes (Signed)
Subjective: Subjective:    Tresa Garteryson L Myre is a 33 y.o. male who presents for back pain. The patient has had recurrent self limited episodes of low back pain in the past.    Symptoms have been present for 3 days and are unchanged.  Onset was related to / precipitated by no known injury. The pain is located in the across the lower back and radiates to the bilat sides of body. The pain is described as aching and bulging pain and occurs intermittently.  Symptoms are exacerbated by extension, flexion, lifting, lying down, sitting and standing. Symptoms are improved by heat and TENS unit from prior therapy. He has also tried NSAIDs which provided no symptom relief. He has no other symptoms associated with the back pain. The patient has no "red flag" history indicative of complicated back pain.  The following portions of the patient's history were reviewed and updated as appropriate: allergies, current medications, past family history, past medical history, past social history, past surgical history and problem list.   Review of Systems Constitutional: denies fever, chills, sweats, unexpected weight change, anorexia, fatigue Dermatology: denies changing moles, rash, lumps, new worrisome lesions Cardiology: denies chest pain, palpitations, edema, orthopnea, paroxysmal nocturnal dyspnea Respiratory: denies cough, shortness of breath, dyspnea on exertion, wheezing, hemoptysis Gastroenterology: denies abdominal pain, nausea, vomiting, diarrhea, constipation, blood in stool, changes in bowel movement, dysphagia Musculoskeletal: denies arthralgias, myalgias, joint swelling, neck pain, cramping, gait changes Urology: denies dysuria, difficulty urinating, hematuria, urinary frequency, urgency, incontinence Neurology: no headache, weakness, tingling, numbness      Objective:     Filed Vitals:   07/07/13 1427  BP: 100/68  Pulse: 64  Temp: 97.4 F (36.3 C)  Resp: 14    General appearance: alert, no  distress, WD/WN, male Abdomen: +bs, soft, non tender, non distended, no masses, no hepatomegaly, no splenomegaly, no bruits Back: mild lumbar paraspinal tenderness, otherwise non tender, normal ROM, no scoliosis Musculoskeletal: lower extremities non tender, no obvious deformity, normal ROM throughout Extremities: no edema, no cyanosis, no clubbing Pulses: 2+ symmetric, upper and lower extremities, normal cap refill Neurological: normal LE strength, DTRs and sensation     Assessment:   Encounter Diagnoses  Name Primary?  . Back pain Yes  . Spasm of back muscles      Plan:   Advise short-term rest, consider massage therapy, begin over-the-counter Aleve, prescription for Flexeril when necessary, discussed gentle range of motion stretching, followup if not improving.  Return if symptoms worsen or fail to improve.

## 2013-09-12 ENCOUNTER — Encounter: Payer: Self-pay | Admitting: Medical

## 2013-09-12 ENCOUNTER — Ambulatory Visit (INDEPENDENT_AMBULATORY_CARE_PROVIDER_SITE_OTHER): Payer: BC Managed Care – PPO | Admitting: Medical

## 2013-09-12 VITALS — BP 120/78 | HR 68 | Temp 98.1°F | Resp 14 | Wt 152.0 lb

## 2013-09-12 DIAGNOSIS — Z634 Disappearance and death of family member: Secondary | ICD-10-CM

## 2013-09-12 DIAGNOSIS — F329 Major depressive disorder, single episode, unspecified: Secondary | ICD-10-CM

## 2013-09-12 DIAGNOSIS — R4589 Other symptoms and signs involving emotional state: Secondary | ICD-10-CM

## 2013-09-12 DIAGNOSIS — F3289 Other specified depressive episodes: Secondary | ICD-10-CM

## 2013-09-12 DIAGNOSIS — F4321 Adjustment disorder with depressed mood: Secondary | ICD-10-CM

## 2013-09-12 DIAGNOSIS — F4329 Adjustment disorder with other symptoms: Secondary | ICD-10-CM

## 2013-09-12 NOTE — Progress Notes (Signed)
Subjective:  Ryan Russell is a 33 y.o. male who presents for concerns, wants to discuss FMLA.  He is still having trouble dealing with the death of his mother.  She passed away 03/2013, had cancer in liver, metastasized, was stage IV.  She was ill from June until 03/2013.  This was her second time with cancer diagnosis. They were not sure of the primary tumor.  I saw him in November regarding this.  Since last visit, he reports that he is still having trouble dealing with the death of his mother.  Gets depressed, feels down in his mood.  Has been sleeping more, has missed work or overslept several times in the past few months due to depressed mood.  Seeing Ryan Russell counselor through hospice, [phone].  He has not been on antidepressant medication during this time frame.  No other aggravating or relieving factors.  No other c/o.  The following portions of the patient's history were reviewed and updated as appropriate: allergies, current medications, past family history, past medical history, past social history, past surgical history and problem list.  ROS Otherwise as in subjective above  Objective: Physical Exam  BP 120/78  Pulse 68  Temp(Src) 98.1 F (36.7 C) (Oral)  Resp 14  Wt 152 lb (68.947 kg)   General appearance: alert, no distress, WD/WN   Assessment: Encounter Diagnoses  Name Primary?  . Depressed mood Yes  . Complicated bereavement      Plan: Discussed his concerns, his mood, reviewed his PhQ 9 with a score of 13.  I called and left message [facility].  I advised that I can't write him out of work or complete FMLA for this purpose.   Advised he will need to see counselor for this.  Offered medication/SSRI but he declines.  I will try and speak with his counselor, will await her call.

## 2014-01-22 ENCOUNTER — Encounter: Payer: Self-pay | Admitting: Medical

## 2014-02-20 ENCOUNTER — Ambulatory Visit (INDEPENDENT_AMBULATORY_CARE_PROVIDER_SITE_OTHER): Payer: BC Managed Care – PPO | Admitting: Medical

## 2014-02-20 ENCOUNTER — Encounter: Payer: Self-pay | Admitting: Family Medicine

## 2014-02-20 ENCOUNTER — Encounter: Payer: Self-pay | Admitting: Medical

## 2014-02-20 VITALS — BP 120/60 | HR 60 | Temp 98.0°F | Resp 16 | Wt 149.0 lb

## 2014-02-20 DIAGNOSIS — S39012A Strain of muscle, fascia and tendon of lower back, initial encounter: Secondary | ICD-10-CM

## 2014-02-20 MED ORDER — CYCLOBENZAPRINE HCL 5 MG PO TABS: 5.0000 mg | ORAL_TABLET | Freq: Every day | ORAL | Status: DC

## 2014-02-20 NOTE — Progress Notes (Signed)
Subjective: Here today for back pain.  Back pain started Friday after playing music/saxophone at a show.  Later that evening started getting aching pain in low back.  Did some stretching next morning and weekend.   Monday pain not improved, so decided to come in and be seen.  Pain doesn't radiate, no leg pian, no numbness, tingling, no weakness, no incontinence, no saddle anesthesia. Sitting too long in same position worsens .  Thinks he had similar a year ago that resolved on its own.  He note some intermittent back problems in the past, saw PT.   Also feels exhausted.  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   Using nothing for this.   No abdominal pain.  No other GU symptoms.   No other aggravating or relieving factors.  No other c/o.  ROS as in subjective   Objective: BP 120/60  Pulse 60  Temp(Src) 98 F (36.7 C) (Oral)  Resp 16  Wt 149 lb (67.586 kg)  General appearance: alert, no distress, WD/WN, lean AA male Abdomen: +bs, soft, non tender, non distended, no masses, no hepatomegaly, no splenomegaly Back: non tender, mild pain with flexion, but otherwise ROM normal, no deformity Musculoskeletal:LE  nontender, no swelling, no obvious deformity Extremities: no edema Pulses: 2+ symmetric, upper and lower extremities, normal cap refill Neurological: LE strength normal, DTRs normal, sensation normal, normal heel and toe walk Psychiatric: normal affect, behavior normal, pleasant     Assessment: Encounter Diagnosis  Name Primary?  . Back strain, initial encounter Yes    Plan: Reassured, advised no heavy lifting for the next few days, can use gentle stretching and ROM, heat topically, Ibuprofen OTC, flexeril 5mg  QHS prn, discussed proper lifting.   F/u prn.

## 2014-03-05 ENCOUNTER — Encounter: Payer: Self-pay | Admitting: Family Medicine

## 2014-03-05 ENCOUNTER — Ambulatory Visit (INDEPENDENT_AMBULATORY_CARE_PROVIDER_SITE_OTHER): Payer: BC Managed Care – PPO | Admitting: Family Medicine

## 2014-03-05 VITALS — BP 108/66 | HR 64 | Temp 97.3°F | Ht 71.0 in | Wt 152.0 lb

## 2014-03-05 DIAGNOSIS — R0781 Pleurodynia: Secondary | ICD-10-CM

## 2014-03-05 DIAGNOSIS — R109 Unspecified abdominal pain: Secondary | ICD-10-CM

## 2014-03-05 LAB — POCT URINALYSIS DIPSTICK
Bilirubin, UA: NEGATIVE
Blood, UA: NEGATIVE
Glucose, UA: NEGATIVE
KETONES UA: NEGATIVE
LEUKOCYTES UA: NEGATIVE
Nitrite, UA: NEGATIVE
PH UA: 8
PROTEIN UA: NEGATIVE
Spec Grav, UA: <=1.005
UROBILINOGEN UA: NEGATIVE

## 2014-03-05 MED ORDER — NAPROXEN 500 MG PO TABS: 500.0000 mg | ORAL_TABLET | Freq: Two times a day (BID) | ORAL | Status: DC

## 2014-03-05 NOTE — Progress Notes (Signed)
Chief Complaint  Patient presents with  . Flank Pain    left sided abdominal pain x several weeks. Has worsened since last night. Pt declined flu vaccine today.    Over the last few weeks, on and off, he has had pain at his left lateral ribs.  Last night it got acutely worse. 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  He didn't think he was particularly active during the show yesterday.  He noticed increased pain after the show, when getting into his car (about an hour later).  Increased pain with movements, reaching, bending, twisting.  Only minimal increase when he takes a deep breath. He slept fine last night, but upon awakening, has had much more pain today.  He hasn't taken anything yet today for pain.  He denies any urinary complaints--urgency, frequency, dysuria or hematuria.  Currently pain is shooting up to 7/10, less when still.  PMH, PSH, SH reviewed in detail  Outpatient Encounter Prescriptions as of 03/05/2014  Medication Sig Note  . cyclobenzaprine (FLEXERIL) 5 MG tablet Take 1 tablet (5 mg total) by mouth at bedtime. 03/05/2014: He filled this a few weeks ago, but hasn't needed to use it   No Known Allergies  ROS:  Denies nausea, vomiting, diarrhea.  Occasional stomach pain with spicy foods. No fevers, chills, headaches, dizziness, fainting spells. Denies chest pain, palpitations. Denies cough, wheezing.  Sometimes has some slight shortness of breath--notes that he is breathing shallower since there is slight discomfort with deeper breaths. No urinary complaints. No bleeding, bruising, rash.  See HPI   PHYSICAL EXAM: BP 108/66 mmHg  Pulse 64  Temp(Src) 97.3 F (36.3 C) (Tympanic)  Ht 5\' 11"  (1.803 m)  Wt 152 lb (68.947 kg)  BMI 21.21 kg/m2 Tired appearing male, holding his left lower/lateral ribs during visit.  He doesn't appear to be in acute distress at rest, some discomfort with position changes. Neck: no  lymphadenopathy or mass Heart: regular rate and rhythm Lungs: clear bilaterally. Tender laterally at floating ribs Very mild L CVA tenderness, mostly very superficially over ribs.  No spinal tenderness or palpable muscle spasm Abdomen: soft, nontender, no organomegaly or mass Extremities: no edema Skin: no rashes   ASSESSMENT/PLAN:  Left sided abdominal pain - Plan: POCT Urinalysis Dipstick  Rib pain on left side - Plan: naproxen (NAPROSYN) 500 MG tablet   Try heating pad, gentle stretches. Take the naproxen twice daily with food. You may use tylenol if needed in addition, for pain relief.  You could also try Biofreeze, Icy Hot (topical pain relief). Avoid a lot of bending/twisting. Can use the flexeril he has at home (hasn't really been able to due to extensive work hours, and risk for sedation)  If you develop any blisters/rash in this area of pain, then it is likely shingles--please call and let us know (you will need to be treated with a medication for Shingles if rash develops).  NSAID precautions were reviewed with the patient.  Patient should take medication with food, discontinue if develops GI side effects, not to take other OTC NSAIDs at the same time, and not to use longer than recommended.  Declines toradol injection.  Declines flu shot

## 2014-03-05 NOTE — Patient Instructions (Addendum)
NSAID precautions were reviewed with the patient.  Patient should take medication with food, discontinue if develops GI side effects, not to take other OTC NSAIDs at the same time, and not to use longer than recommended.   Try heating pad, gentle stretches. Take the naproxen twice daily with food. You may use tylenol if needed in addition, for pain relief.  You could also try Biofreeze, Icy Hot (topical pain relief). Avoid a lot of bending/twisting.  If you develop any blisters/rash in this area of pain, then it is likely shingles--please call and let us know (you will need to be treated with a medication for Shingles if rash develops).  Return if pain increases/persists.  Return if shortness of breath, or other new symptoms develop, for re-evaluation.

## 2014-03-15 ENCOUNTER — Other Ambulatory Visit: Payer: Self-pay | Admitting: Medical

## 2014-03-15 NOTE — Telephone Encounter (Signed)
Ok to RF? 

## 2014-04-19 ENCOUNTER — Encounter: Payer: Self-pay | Admitting: Medical

## 2014-04-19 ENCOUNTER — Ambulatory Visit (INDEPENDENT_AMBULATORY_CARE_PROVIDER_SITE_OTHER): Payer: BC Managed Care – PPO | Admitting: Medical

## 2014-04-19 VITALS — BP 100/70 | HR 64 | Temp 98.2°F | Resp 16 | Wt 152.0 lb

## 2014-04-19 DIAGNOSIS — R229 Localized swelling, mass and lump, unspecified: Secondary | ICD-10-CM

## 2014-04-19 MED ORDER — MUPIROCIN 2 % EX OINT: 1.0000 "application " | TOPICAL_OINTMENT | Freq: Three times a day (TID) | CUTANEOUS | Status: DC

## 2014-04-19 NOTE — Progress Notes (Signed)
Subjective: Here for a bump in right ear.  Was painful few days ago.  No drainage.  Trying to put ear plugs in caused pain, hurting to chew at times.  No fever.  No dizziness.  No other aggravating or relieving factors. No other complaint.  Objective: Gen: wd, wn, nad Right posterior tragus with 3mm raised papular soft tissue lesion.  Not indurated, no erythema, no fluctuance.  Mildly tender.  Ear canal and TM normal.  No discharge.     Assessment: Encounter Diagnosis  Name Primary?  . Lump of skin Yes    Plan: Nonspecific lesion, but given how quick it appeared and tender, begin Mupirocin ointment TID x 5- 7 days, good hygiene.  If worse or not improving within a week then recheck.

## 2014-05-15 ENCOUNTER — Ambulatory Visit: Payer: Self-pay | Admitting: Medical

## 2014-05-16 ENCOUNTER — Ambulatory Visit (INDEPENDENT_AMBULATORY_CARE_PROVIDER_SITE_OTHER): Payer: BLUE CROSS/BLUE SHIELD | Admitting: Family Medicine

## 2014-05-16 ENCOUNTER — Encounter: Payer: Self-pay | Admitting: Family Medicine

## 2014-05-16 VITALS — BP 112/74 | HR 60 | Ht 71.0 in | Wt 151.0 lb

## 2014-05-16 DIAGNOSIS — J029 Acute pharyngitis, unspecified: Secondary | ICD-10-CM

## 2014-05-16 DIAGNOSIS — R1013 Epigastric pain: Secondary | ICD-10-CM

## 2014-05-16 DIAGNOSIS — R202 Paresthesia of skin: Secondary | ICD-10-CM

## 2014-05-16 DIAGNOSIS — G5600 Carpal tunnel syndrome, unspecified upper limb: Secondary | ICD-10-CM

## 2014-05-16 MED ORDER — NAPROXEN 500 MG PO TABS: 500.0000 mg | ORAL_TABLET | Freq: Two times a day (BID) | ORAL | Status: DC

## 2014-05-16 NOTE — Patient Instructions (Signed)
Carpal Tunnel Syndrome The carpal tunnel is a narrow area located on the palm side of your wrist. The tunnel is formed by the wrist bones and ligaments. Nerves, blood vessels, and tendons pass through the carpal tunnel. Repeated wrist motion or certain diseases may cause swelling within the tunnel. This swelling pinches the main nerve in the wrist (median nerve) and causes the painful hand and arm condition called carpal tunnel syndrome. CAUSES   Repeated wrist motions.  Wrist injuries.  Certain diseases like arthritis, diabetes, alcoholism, hyperthyroidism, and kidney failure.  Obesity.  Pregnancy. SYMPTOMS   A "pins and needles" feeling in your fingers or hand, especially in your thumb, index and middle fingers.  Tingling or numbness in your fingers or hand.  An aching feeling in your entire arm, especially when your wrist and elbow are bent for long periods of time.  Wrist pain that goes up your arm to your shoulder.  Pain that goes down into your palm or fingers.  A weak feeling in your hands. DIAGNOSIS  Your health care provider will take your history and perform a physical exam. An electromyography test may be needed. This test measures electrical signals sent out by your nerves into the muscles. The electrical signals are usually slowed by carpal tunnel syndrome. You may also need X-rays. TREATMENT  Carpal tunnel syndrome may clear up by itself. Your health care provider may recommend a wrist splint or medicine such as a nonsteroidal anti-inflammatory medicine. Cortisone injections may help. Sometimes, surgery may be needed to free the pinched nerve.  HOME CARE INSTRUCTIONS   Take all medicine as directed by your health care provider. Only take over-the-counter or prescription medicines for pain, discomfort, or fever as directed by your health care provider.  If you were given a splint to keep your wrist from bending, wear it as directed. It is important to wear the splint at  night. Wear the splint for as long as you have pain or numbness in your hand, arm, or wrist. This may take 1 to 2 months.  Rest your wrist from any activity that may be causing your pain. If your symptoms are work-related, you may need to talk to your employer about changing to a job that does not require using your wrist.  Put ice on your wrist after long periods of wrist activity.  Put ice in a plastic bag.  Place a towel between your skin and the bag.  Leave the ice on for 15-20 minutes, 03-04 times a day.  Keep all follow-up visits as directed by your health care provider. This includes any orthopedic referrals, physical therapy, and rehabilitation. Any delay in getting necessary care could result in a delay or failure of your condition to heal. SEEK IMMEDIATE MEDICAL CARE IF:   You have new, unexplained symptoms.  Your symptoms get worse and are not helped or controlled with medicines. MAKE SURE YOU:   Understand these instructions.  Will watch your condition.  Will get help right away if you are not doing well or get worse. Document Released: 04/17/2000 Document Revised: 09/04/2013 Document Reviewed: 03/06/2011 [facility] Patient Information 2015 [employer] [place]. This information is not intended to replace advice given to you by your health care provider. Make sure you discuss any questions you have with your health care provider.   Cut back on caffeine, alcohol, acidic and citrus foods, and spicy foods. Try and eat a bland diet.  Take Prilosec OTC once daily for 2 weeks.  Your sore throat  might be related to postnasal drainage.  Use claritin or sudafed if needed.  Take your prescribed anti-inflammatory medication with food; discontinue or cut back the dose if you develop stomach pain/discomfort/side effects.  Do not take other over-the-counter pain medications such as ibuprofen, advil, motrin, aleve, naproxen at the same time.  Do not use longer than recommended.  It is okay to  use acetaminophen (tylenol) along with this medication.  Return in 2 weeks, sooner if any of your symptoms are getting significantly worse, rather than better.

## 2014-05-16 NOTE — Progress Notes (Signed)
Chief Complaint  Patient presents with  . Numbness    tingling and pain of right leg that began a week ago. Right arm numbness, B/L fingertip numbness and his waistline bilaterally is sore from time to time.    1/6 he noticed that this right leg and foot was hard to walk on because it felt weak.  Later, he noticed tingling in the leg. He then started having some pain in his calf with movements (not at rest).  He tried stretching.  He tried rubbing alcohol on it, but otherwise hasn't tried any treatments. No improvement was noticed. Calf pain isn't as much, more just numb now. He still finds it is hard to walk after he has been sitting or laying for any period of time.    He had some slight back pain in the middle of the back.  A couple of days ago he noticed some numbness in his fingertips.  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 He really can't recall exactly which fingers were affected, but thinks 2-5 (not thumb).  Found it a little hard to grasp things at the time.  It lasted for just a few minutes. Symptoms have been on and off since then. Recurrences have been at work. Since the hands went numb a couple of days ago, he has developed many other symptoms--feeling lightheaded, sore throat, right and left waist pain and some pain in his stomach. No nausea, vomiting or diarrhea. No rashes, bleeding, bruising. No chills, some subjective low grade fevers. No sick contacts. He also has some runny nose.  Upper abdominal pain. No nausea, vomiting.  Occasional heartburn.  No urinary complaints.  Admits to being under a lot of stress.  PMH, PSH, SH reviewed.  Outpatient Encounter Prescriptions as of 05/16/2014  Medication Sig  . [DISCONTINUED] mupirocin ointment (BACTROBAN) 2 % Place 1 application into the nose 3 (three) times daily.   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 He states he came straight from work, and he tried to take a nap in the parking lot prior to visit, but didn't. HEENT: eyes were mostly closed. No conjunctival injection.   Nasal mucosa is mildly edematous, pale no purulence. OP is clear Neck: no lymphadenopathy, thyromegaly or mass. No spinal tenderness Back: tender over R SI joint and in deep buttock muscles on the right. No CVA tenderness or spinal tenderness  +phalen--tingling in left 2nd and 3rd fingers.  Mildly positive Tinel on the left. Abdomen: soft.  Mild epigastric tenderness. No organomegaly or mass.  Extremities: no edema, normal pulses.  No calf tenderness.  Neuro: normal strength, sensation, heel/toe walking, DTR's are symmetric bilaterally.  Negative straight leg raise (just slight hamstring tightness) Psych: appears very tired--hard to assess.  He mentions worry/anxiety given family history of health problems. Normal hygiene and grooming.  ASSESSMENT/PLAN:  Carpal tunnel syndrome, unspecified laterality - left; braces while at work. course of NSAID to decrease inflammation of nerve - Plan: naproxen (NAPROSYN) 500 MG tablet  Left leg paresthesias - suspect related to spasm of deep buttock muscles/sciatica. normal neuro exam. NSAIDs should help.  f/u if worse, esp if weakness  Abdominal pain, epigastric - PPI trial x 2 weeks, especially given that he is being rx'd NSAIDs  Sore throat - exam  is consistent with URI, with sore throat likely from PND.    Carpal tunnel syndrome--on left per exam, but bilateral per history. Wrist brace and naproxen.  Right leg pain--suspect related to SI dysfunction and pyriformis syndrome.  Naproxen and stretches.  No evidence of weakness.  Stomach pain--cut back on caffeine.  Take OTC Prilosec once daily x 2 weeks.  Sore throat--might have the start of a virus--supportive measures.  F/u 2 weeks, sooner if worse

## 2014-05-30 ENCOUNTER — Encounter: Payer: Self-pay | Admitting: Family Medicine

## 2014-05-30 ENCOUNTER — Ambulatory Visit (INDEPENDENT_AMBULATORY_CARE_PROVIDER_SITE_OTHER): Payer: BLUE CROSS/BLUE SHIELD | Admitting: Family Medicine

## 2014-05-30 VITALS — BP 106/72 | HR 60 | Ht 71.0 in | Wt 152.0 lb

## 2014-05-30 DIAGNOSIS — G5600 Carpal tunnel syndrome, unspecified upper limb: Secondary | ICD-10-CM

## 2014-05-30 DIAGNOSIS — R1013 Epigastric pain: Secondary | ICD-10-CM

## 2014-05-30 DIAGNOSIS — G5701 Lesion of sciatic nerve, right lower limb: Secondary | ICD-10-CM

## 2014-05-30 NOTE — Progress Notes (Signed)
Chief Complaint  Patient presents with  . Follow-up    still feels like it is hard to move around but overall leg is feeling better, less numb. Abdominal pain has subsided-took only few days of meds.    Patient presents for 2 week follow up on carpal tunnel syndrome (left), left leg numbness, and epigastric pain.  He took it twice daily only for about 2-3 days, and stopped it because he felt better.  He had been having tingling in the right leg, and then calf pain (which then changed to numbness).  He described it as being hard to walk after sitting/laying for a period of time. It was felt that he had some spasm of deep buttock muscles and sciatica.  He was prescribed Naproxen and stretches.  He reports that this has improved.  He can move easier now, numbness is only very rare.  He had numbness in his fingertips (mainly left), especially while at work. He was prescribed naproxen and told to wear wrist brace at work. He no longer feels any numbness.  He admits that he never wore a wrist brace, but changed how he did some things, "not working so hard".   He had epigastric abdominal pain at his last visit.  He was told to take Prilosec OTC, especially given that he was being prescribed NSAID.  He never got the Prilosec, but started taking some Aloe, which seemed to help.  Abdominal pain resolved. Denies heartburn, nausea, bowel changes, or any recurrent pain.  PMH, PSH, SH reviewed.  Outpatient Encounter Prescriptions as of 05/30/2014  Medication Sig Note  . Aloe LIQD Take by mouth. 05/30/2014: Unsure of dosage-takes EOD  . naproxen (NAPROSYN) 500 MG tablet Take 1 tablet (500 mg total) by mouth 2 (two) times daily with a meal. (Patient not taking: Reported on 05/30/2014) 05/30/2014: Took only for 2-3 days   No Known Allergies  ROS: no fevers, chills, URI symptoms, headaches, dizziness. No further numbness/tingling/weakness, back pain.  Occasionally feels slight "strain" in leg or back, but no regular  pain.  No rashes, bleeding, bruising or other complaints. See HPI. He is tired (up late for show).  PHYSICAL EXAM: BP 106/72 mmHg  Pulse 60  Ht 5\' 11"  (1.803 m)  Wt 152 lb (68.947 kg)  BMI 21.21 kg/m2 Pleasant male, again appearing very tired, trouble keeping eyes open during visit.  He is oriented, and at times very alert/appropriate, other times appears very sleepy. Neck: no lymphadenopathy or mass Heart: regular rate and rhythm Lungs: clear bilaterally Back: no CVA or spinal tenderness Pain in right buttock with pyriformis stretch.  Negative straight leg raise. Normal strength, sensation, DTR's. Negative Phalen. Normal strength, sensation in UE's. Abdomen: soft, very mild epigastric tenderness. No rebound tenderness, guarding or organomegaly  ASSESSMENT/PLAN:  Pyriformis syndrome, right - again showed stretches, to do 5-10 reps 2-3x/day. pain/paresthesias overall improved. restart Naproxen if recurrent problems  Abdominal pain, epigastric - significantly improved. to use Prilosec if recurrent pain, or if needs to restart naproxen  Carpal tunnel syndrome, unspecified laterality - improved/resolved.    Your carpal tunnel syndrome seems better.  If you have recurrent symptoms of numbness in your fingertips, then start wearing the wrist brace as we discussed last time (especially at work), and restart the anti-inflammatory.  You still have some spasm in the deep buttock muscles, which is causing you some pain in your right leg.  Do the stretches as you were shown--at least 5-10 repetitions, at least twice daily. If  you have recurrent pain or numbness, resume taking the naproxen twice daily with food for up to another 7-10 days (you should have plenty left over in your bottle).  You still have mild tenderness in your stomach.  Especially if you need to restart taking the naproxen, then take Prilosec OTC along with it, once daily.  This will help with the upper abdominal pain.  Try and  avoid alcohol, caffeine, spicy foods that can also contribute to stomach discomfort.

## 2014-05-30 NOTE — Patient Instructions (Signed)
  Your carpal tunnel syndrome seems better.  If you have recurrent symptoms of numbness in your fingertips, then start wearing the wrist brace as we discussed last time (especially at work), and restart the anti-inflammatory.  You still have some spasm in the deep buttock muscles, which is causing you some pain in your right leg.  Do the stretches as you were shown--at least 5-10 repetitions, at least twice daily. If you have recurrent pain or numbness, resume taking the naproxen twice daily with food for up to another 7-10 days (you should have plenty left over in your bottle).  You still have mild tenderness in your stomach.  Especially if you need to restart taking the naproxen, then take Prilosec OTC along with it, once daily.  This will help with the upper abdominal pain.  Try and avoid alcohol, caffeine, spicy foods that can also contribute to stomach discomfort.  Mild epigastric tenderness (improved compared to last visit).

## 2014-06-14 ENCOUNTER — Encounter: Payer: Self-pay | Admitting: Medical

## 2014-06-14 ENCOUNTER — Ambulatory Visit (INDEPENDENT_AMBULATORY_CARE_PROVIDER_SITE_OTHER): Payer: BLUE CROSS/BLUE SHIELD | Admitting: Medical

## 2014-06-14 VITALS — BP 108/64 | HR 68 | Temp 98.0°F | Resp 15 | Wt 150.0 lb

## 2014-06-14 DIAGNOSIS — R05 Cough: Secondary | ICD-10-CM

## 2014-06-14 DIAGNOSIS — R0982 Postnasal drip: Secondary | ICD-10-CM

## 2014-06-14 DIAGNOSIS — R059 Cough, unspecified: Secondary | ICD-10-CM

## 2014-06-14 MED ORDER — BENZONATATE 200 MG PO CAPS: 200.0000 mg | ORAL_CAPSULE | Freq: Three times a day (TID) | ORAL | Status: DC | PRN

## 2014-06-14 NOTE — Progress Notes (Signed)
Subjective:  Ryan Russell is a 34 y.o. male who presents for cough x 2 weeks.  Symptoms include cough, post nasal drainage, wheeze sound with cough, nausea, some upset stomach.  Some sneezing, no itchy eyes. Denies fever, vomiting, ear pain, diarrhea.  Has some production, sometimes colored mucous.    Treatment to date: cough suppressants.  No sick contacts.   He does not smoke.   He does not have a history of asthma.  No animals at home. No other aggravating or relieving factors.  No other c/o.  The following portions of the patient's history were reviewed and updated as appropriate: allergies, current medications, past family history, past medical history, past social history, past surgical history and problem list.  ROS as in subjective  Past Medical History  Diagnosis Date  . Allergy   . GERD (gastroesophageal reflux disease)   . Leukopenia     Hematology eval 2012, Dr. Dalene Carrowdogwu, felt to be ethnicity related  . Wears glasses   . Hearing loss 2014    mild, high frequency, audiology eval   . History of back pain     prior physical therapy  . Anxiety     intermittent    Objective: BP 108/64 mmHg  Pulse 68  Temp(Src) 98 F (36.7 C) (Oral)  Resp 15  Wt 150 lb (68.04 kg)  SpO2 97%  General appearance: Alert, WD/WN, no distress                             Skin: warm, no rash, no diaphoresis                           Head: no sinus tenderness                            Eyes: conjunctiva normal, corneas clear, PERRLA                            Ears: pearly TMs, external ear canals normal                          Nose: septum midline, turbinates swollen, with erythema and clear discharge             Mouth/throat: MMM, tongue normal, no pharyngeal erythema                           Neck: supple, no adenopathy, no thyromegaly, nontender                          Heart: RRR, normal S1, S2, no murmurs                         Lungs: clear , no rhonchi, no wheezes, no rales  Extremities: no edema, nontender     Assessment: Encounter Diagnoses  Name Primary?  . Cough Yes  . Post-nasal drip     Plan:  Medication orders today include:Tessalon Perles.  Suggested symptomatic treatment, can use Tessalon Perles, OTC Bendaryl, Tylenol or Ibuprofen OTC for fever and malaise.  Call/return in 1 wk if symptoms are worse or not improving.

## 2014-07-17 ENCOUNTER — Encounter: Payer: Self-pay | Admitting: Medical

## 2014-07-17 ENCOUNTER — Ambulatory Visit (INDEPENDENT_AMBULATORY_CARE_PROVIDER_SITE_OTHER): Payer: BLUE CROSS/BLUE SHIELD | Admitting: Medical

## 2014-07-17 VITALS — BP 120/70 | HR 76 | Temp 98.1°F | Resp 15 | Wt 155.8 lb

## 2014-07-17 DIAGNOSIS — K219 Gastro-esophageal reflux disease without esophagitis: Secondary | ICD-10-CM

## 2014-07-17 DIAGNOSIS — R05 Cough: Secondary | ICD-10-CM

## 2014-07-17 DIAGNOSIS — J029 Acute pharyngitis, unspecified: Secondary | ICD-10-CM

## 2014-07-17 DIAGNOSIS — R059 Cough, unspecified: Secondary | ICD-10-CM

## 2014-07-17 MED ORDER — DEXLANSOPRAZOLE 60 MG PO CPDR: 60.0000 mg | DELAYED_RELEASE_CAPSULE | Freq: Every day | ORAL | Status: DC

## 2014-07-17 NOTE — Progress Notes (Signed)
Subjective: Here for c/o sore throat and stomach pain x few weeks.  Been having stomach ache in general and sore throat and cough.  Gets bad cough spells.  Some nausea.  Does eat acidic foods, eats late night often too.   No headache, no sinus pressure, no ear pain.  No fever, no vomiting, no diarrhea.  No sick contacts, No other aggravating or relieving factors. No other complaint.   Past Medical History  Diagnosis Date  . Allergy   . GERD (gastroesophageal reflux disease)   . Leukopenia     Hematology eval 2012, Dr. Dalene Carrowdogwu, felt to be ethnicity related  . Wears glasses   . Hearing loss 2014    mild, high frequency, audiology eval   . History of back pain     prior physical therapy  . Anxiety     intermittent   ROS as in subjective   Objective: BP 120/70 mmHg  Pulse 76  Temp(Src) 98.1 F (36.7 C) (Oral)  Resp 15  Wt 155 lb 12.8 oz (70.67 kg)  General appearance: alert, no distress, WD/WN HEENT: normocephalic, sclerae anicteric, TMs pearly, nares patent, no discharge or erythema, pharynx normal Oral cavity: MMM, no lesions Neck: supple, no lymphadenopathy, no thyromegaly, no masses Heart: RRR, normal S1, S2, no murmurs Lungs: CTA bilaterally, no wheezes, rhonchi, or rales Abdomen: +bs, soft, non tender, non distended, no masses, no hepatomegaly, no splenomegaly Pulses: 2+ symmetric, upper and lower extremities, normal cap refill       Assessment: Encounter Diagnoses  Name Primary?  . Sore throat Yes  . Cough   . Gastroesophageal reflux disease without esophagitis      Plan: Samples of dexilant given, avoid GERD food triggers, after samples run out use OTC Prilosec another 2 wk, then tums prn, prilosec prn.  F/u prn.

## 2015-11-11 ENCOUNTER — Encounter: Payer: Self-pay | Admitting: Medical

## 2015-11-11 ENCOUNTER — Ambulatory Visit
Admission: RE | Admit: 2015-11-11 | Discharge: 2015-11-11 | Disposition: A | Discharge status: Home / Self Care | Payer: BLUE CROSS/BLUE SHIELD | Source: Ambulatory Visit | Attending: Medical | Admitting: Medical

## 2015-11-11 ENCOUNTER — Ambulatory Visit (INDEPENDENT_AMBULATORY_CARE_PROVIDER_SITE_OTHER): Payer: BLUE CROSS/BLUE SHIELD | Admitting: Medical

## 2015-11-11 VITALS — BP 110/70 | HR 68 | Wt 145.0 lb

## 2015-11-11 DIAGNOSIS — M79642 Pain in left hand: Secondary | ICD-10-CM

## 2015-11-11 DIAGNOSIS — S6992XA Unspecified injury of left wrist, hand and finger(s), initial encounter: Secondary | ICD-10-CM

## 2015-11-11 NOTE — Progress Notes (Signed)
Subjective: Chief Complaint  Patient presents with  . lt hand pain    around knuckles, got mad and punched a wall a week or so ago.    Here for injury.   DOI 11/04/15.  Was upset about several things, got a little out of hand, punched a wall.  2 of his family members were arguing, one was getting loud and he was frustrated, and out of frustration hit a wall.  He was upset over the family member's argument.   He notes not doing this prior, and doesn't plan to do it again.  Is left handed.  Punched wall with left hand.  Hurts currently in left 2nd -4th fingers.    Denies bruising, not sure if the fingers were swollen to begin with.   Has used ice several days this week.  Used some aleve a few days.  Been still having pain at work using the hand, wanted to get checked out.     Past Medical History  Diagnosis Date  . Allergy   . GERD (gastroesophageal reflux disease)   . Leukopenia     Hematology eval 2012, Dr. Dalene Carrowdogwu, felt to be ethnicity related  . Wears glasses   . Hearing loss 2014    mild, high frequency, audiology eval   . History of back pain     prior physical therapy  . Anxiety     intermittent   ROS as in subjective    Objective: BP 110/70 mmHg  Pulse 68  Wt 145 lb (65.772 kg)  Gen: wd, wn, nad Skin: no obvious erythema or bruising No obvious swelling Left hand tender along proximal phalanx and MCP of 2nd -4th fingers, but no obvious deformity,no obvious swelling or step off Normal ROM of fingers and hand sensation and strength of fingers and hand seems normal    Assessment: Encounter Diagnoses  Name Primary?  . Hand injuries, left, initial encounter Yes  . Hand pain, left       Plan: I doubt fracture, but will send for xray.  Advised few more days of ice, OTC Aleve, protecting the hand/finger, relative rest, use caution with lifting and handling boxes at work.  F/u pending xray.  Ryan Russell was seen today for lt hand pain.  Diagnoses and all orders for this  visit:  Hand injuries, left, initial encounter -     DG Hand Complete Left; Future  Hand pain, left -     DG Hand Complete Left; Future

## 2016-01-03 ENCOUNTER — Encounter: Payer: Self-pay | Admitting: Family Medicine

## 2016-01-03 ENCOUNTER — Ambulatory Visit (INDEPENDENT_AMBULATORY_CARE_PROVIDER_SITE_OTHER): Payer: BLUE CROSS/BLUE SHIELD | Admitting: Family Medicine

## 2016-01-03 VITALS — BP 110/70 | HR 57 | Ht 71.0 in | Wt 144.0 lb

## 2016-01-03 DIAGNOSIS — J209 Acute bronchitis, unspecified: Secondary | ICD-10-CM

## 2016-01-03 MED ORDER — CLARITHROMYCIN 500 MG PO TABS: 500.0000 mg | ORAL_TABLET | Freq: Two times a day (BID) | ORAL | 0 refills | Status: DC

## 2016-01-03 NOTE — Progress Notes (Signed)
   Subjective:    Patient ID: Ryan Russell, male    DOB: 1980-11-30, 35 y.o.   MRN: [id number]  HPI He complains of a two-week history of dry cough, rhinorrhea, malaise, slight sore throat and myalgias. He does state that on a couple occasions he has wheeze. Is a no fever or chills. He does not smoke.   Review of Systems     Objective:   Physical Exam Alert and in no distress. Tympanic membranes and canals are normal. Pharyngeal area is normal. Neck is supple without adenopathy or thyromegaly. Cardiac exam shows a regular sinus rhythm without murmurs or gallops. Lungs are clear to auscultation.        Assessment & Plan:  Acute bronchitis, unspecified organism - Plan: clarithromycin (BIAXIN) 500 MG tablet He is to call if no improvement and he finishes the antibiotic.

## 2016-01-03 NOTE — Patient Instructions (Signed)
Take all the antibiotic and if not totally back to normal when you finish give us a call 

## 2016-05-27 ENCOUNTER — Ambulatory Visit (INDEPENDENT_AMBULATORY_CARE_PROVIDER_SITE_OTHER): Payer: BLUE CROSS/BLUE SHIELD | Admitting: Family Medicine

## 2016-05-27 ENCOUNTER — Encounter: Payer: Self-pay | Admitting: Family Medicine

## 2016-05-27 VITALS — BP 120/80 | HR 103 | Temp 100.2°F | Wt 142.0 lb

## 2016-05-27 DIAGNOSIS — R509 Fever, unspecified: Secondary | ICD-10-CM

## 2016-05-27 DIAGNOSIS — J111 Influenza due to unidentified influenza virus with other respiratory manifestations: Secondary | ICD-10-CM

## 2016-05-27 LAB — POC INFLUENZA A&B (BINAX/QUICKVUE)
Influenza A, POC: NEGATIVE
Influenza B, POC: NEGATIVE

## 2016-05-27 NOTE — Patient Instructions (Addendum)

## 2016-05-27 NOTE — Progress Notes (Signed)
   Subjective:    Patient ID: Ryan Russell, male    DOB: Nov 28, 1980, 36 y.o.   MRN: [id number]  HPI He has a four-day history started with fever, chills, myalgias, malaise followed by sore throat, chest congestion and coughing.   Review of Systems     Objective:   Physical Exam Alert and in no distress. Tympanic membranes and canals are normal. Pharyngeal area is normal. Neck is supple without adenopathy or thyromegaly. Cardiac exam shows a regular sinus rhythm without murmurs or gallops. Lungs are clear to auscultation. Flu test is negative       Assessment & Plan:  Fever chills - Plan: POC Influenza A&B (Binax test)  Influenza  Explained that he has the flu. Recommend supportive care. Did give him a note for out of work for today and tomorrow.

## 2016-05-28 ENCOUNTER — Telehealth: Payer: Self-pay

## 2016-05-28 NOTE — Telephone Encounter (Signed)
Ok

## 2016-05-28 NOTE — Telephone Encounter (Signed)
Pt needs OV note for Friday d/t still having high fevers today with flu dx. Ok to provide note to pt?    Pt CB [phone]

## 2016-05-28 NOTE — Telephone Encounter (Signed)
Letter written and placed in basket for pick up. Attempted call to pt with full VCM.  Ryan Russell

## 2016-05-29 NOTE — Telephone Encounter (Signed)
Pt aware note at front desk for pick up. /RLB  

## 2016-07-14 ENCOUNTER — Ambulatory Visit (INDEPENDENT_AMBULATORY_CARE_PROVIDER_SITE_OTHER): Payer: BLUE CROSS/BLUE SHIELD | Admitting: Medical

## 2016-07-14 ENCOUNTER — Ambulatory Visit: Payer: BLUE CROSS/BLUE SHIELD | Admitting: Medical

## 2016-07-14 ENCOUNTER — Encounter: Payer: Self-pay | Admitting: Medical

## 2016-07-14 VITALS — BP 128/72 | HR 61 | Wt 147.8 lb

## 2016-07-14 DIAGNOSIS — R1013 Epigastric pain: Secondary | ICD-10-CM

## 2016-07-14 MED ORDER — DEXLANSOPRAZOLE 60 MG PO CPDR: 60.0000 mg | DELAYED_RELEASE_CAPSULE | Freq: Every day | ORAL | 0 refills | Status: DC

## 2016-07-14 NOTE — Patient Instructions (Signed)

## 2016-07-14 NOTE — Progress Notes (Signed)
Subjective: Chief Complaint  Patient presents with  . sore in his upper stomach    sore in his upper stomach    Here for some pains in his upper stomach.  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  Been going on a few weeks.  Not sure about anxiety, could be nerves.  Pain in upper abdomen is intermittent.   Can last 20-30 minutes typically.   Not related to eating.  Food doesn't seem to affect it.  Does eat a lot of spicy foods, oranges, other.   Using nothing for symptoms.  No other aggravating or relieving factors. No other complaint.   Past Medical History:  Diagnosis Date  . Allergy   . Anxiety    intermittent  . GERD (gastroesophageal reflux disease)   . Hearing loss 2014   mild, high frequency, audiology eval   . History of back pain    prior physical therapy  . Leukopenia    Hematology eval 2012, Dr. Dalene Carrowdogwu, felt to be ethnicity related  . Wears glasses    No current outpatient prescriptions on file prior to visit.   No current facility-administered medications on file prior to visit.    ROS as in subjective  Objective: BP 128/72   Pulse 61   Wt 147 lb 12.8 oz (67 kg)   SpO2 99%   BMI 20.61 kg/m   General appearance: alert, no distress, WD/WN,  HEENT: normocephalic, sclerae anicteric, TMs pearly, nares patent, no discharge or erythema, pharynx normal Oral cavity: MMM, no lesions Neck: supple, no lymphadenopathy, no thyromegaly, no masses Heart: RRR, normal S1, S2, no murmurs Lungs: CTA bilaterally, no wheezes, rhonchi, or rales Abdomen: +bs, soft, non tender, non distended, no masses, no hepatomegaly, no splenomegaly Pulses: 2+ symmetric, upper and lower extremities, normal cap refill   Assessment: Encounter Diagnosis  Name Primary?  . Epigastric pain Yes    Plan: Discussed possible etiology but likely GERD.  Begin samples of dexilant, avoid gerd triggers, reduce stress where possible.  If not resolved within 2 weeks, then let me know.   gave handout on GERD.  Chales Abrahamsyson was seen today for sore in his upper stomach.  Diagnoses and all orders for this visit:  Epigastric pain  Other orders -     dexlansoprazole (DEXILANT) 60 MG capsule; Take 1 capsule (60 mg total) by mouth daily.

## 2016-08-13 ENCOUNTER — Encounter: Payer: Self-pay | Admitting: Family Medicine

## 2016-08-13 ENCOUNTER — Ambulatory Visit (INDEPENDENT_AMBULATORY_CARE_PROVIDER_SITE_OTHER): Payer: BLUE CROSS/BLUE SHIELD | Admitting: Family Medicine

## 2016-08-13 VITALS — BP 114/70 | HR 80 | Wt 145.0 lb

## 2016-08-13 DIAGNOSIS — L989 Disorder of the skin and subcutaneous tissue, unspecified: Secondary | ICD-10-CM

## 2016-08-13 NOTE — Progress Notes (Signed)
   Subjective:    Patient ID: JEVAUN STRICKarr, male    DOB: [birth year], 37 y.o.   MRN: [id number]  HPI  he noticed an itching lesion on the right flank approximately one week ago. He also describes it as a burning sensation but not hot, red or tender. No other lesions are present. He states that it does look like it is healing.  Review of Systems     Objective:   Physical Exam A 1 by one and half centimeter healing lesion is noted in the right flank area. No surrounding lesions. No hyperesthesia.       Assessment & Plan:  Skin lesion of back Explained that I thought this was a healing lesion and no further intervention was necessary. Explained that I did not think that it was in need of an antibiotic or there was evidence of shingles.

## 2016-08-31 ENCOUNTER — Encounter: Payer: Self-pay | Admitting: Medical

## 2016-08-31 ENCOUNTER — Ambulatory Visit (INDEPENDENT_AMBULATORY_CARE_PROVIDER_SITE_OTHER): Payer: BLUE CROSS/BLUE SHIELD | Admitting: Medical

## 2016-08-31 VITALS — BP 108/66 | HR 58 | Wt 146.8 lb

## 2016-08-31 DIAGNOSIS — B359 Dermatophytosis, unspecified: Secondary | ICD-10-CM | POA: Diagnosis not present

## 2016-08-31 DIAGNOSIS — R21 Rash and other nonspecific skin eruption: Secondary | ICD-10-CM | POA: Diagnosis not present

## 2016-08-31 DIAGNOSIS — G5600 Carpal tunnel syndrome, unspecified upper limb: Secondary | ICD-10-CM

## 2016-08-31 DIAGNOSIS — M79604 Pain in right leg: Secondary | ICD-10-CM

## 2016-08-31 MED ORDER — NAPROXEN 500 MG PO TABS: 500.0000 mg | ORAL_TABLET | Freq: Two times a day (BID) | ORAL | 0 refills | Status: DC

## 2016-08-31 MED ORDER — TERBINAFINE HCL 1 % EX CREA: 1.0000 "application " | TOPICAL_CREAM | Freq: Two times a day (BID) | CUTANEOUS | 0 refills | Status: DC

## 2016-08-31 NOTE — Progress Notes (Signed)
Subjective: Chief Complaint  Patient presents with  . rt leg pain , rash    rt leg pain x1 week , rash.,    Here for right leg pain x 1 week. Always on his feet, has a couple different jobs.  Stretches regularly.  Gets pains above knee, below knee, worse with walking.  No swelling.  Some knee pain, but more in the shins.  No ankle or foot pain, no hip pain.  No fall, no specific injury.    Has rash on right flank but has some new rash on other parts of torso.   Started 3 weeks ago.    Has rash on forehead that appeared a week or more ago, but its gotten better. curious about what it is.  No specific trigger.    Past Medical History:  Diagnosis Date  . Allergy   . Anxiety    intermittent  . GERD (gastroesophageal reflux disease)   . Hearing loss 2014   mild, high frequency, audiology eval   . History of back pain    prior physical therapy  . Leukopenia    Hematology eval 2012, Dr. Dalene Carrow, felt to be ethnicity related  . Wears glasses    No current outpatient prescriptions on file prior to visit.   No current facility-administered medications on file prior to visit.    Objective: BP 108/66   Pulse (!) 58   Wt 146 lb 12.8 oz (66.6 kg)   SpO2 98%   BMI 20.47 kg/m   Gen: wd, wn, nad forehand just left and just right of midline with 2 single small flat somewhat demarcated lesions, both flesh colored.   No redness, no roughness or induration.   Right lower back with oval 2.5cm rough rash, slightly raised and few similar smaller <1 cm rough raised rash on left flank, all somewhat suggestive of ringworm Right leg with tenderness along lower leg/shin, mild tenderness of quadriceps, but seems to be tender all along knee but not consistent.  When I press on right medial joint line, he notes pain in posterior knee, when I pressure on left joint line he notes pain in patella, when I press on patella he notes tenderness in right lateral knee, no consistency on exam.  No obvious swelling or  laxity or deformity.  Rest of bilat leg exam unremarkable Legs neurovascularly intact   Assessment: Encounter Diagnoses  Name Primary?  . Rash Yes  . Ringworm   . Right leg pain   . Carpal tunnel syndrome, unspecified laterality     Plan: Discussed findings for rash and leg.  Recommendations as below.  Leg pain - etiology unclear, possible just muscle strain.  No other obvious issue, no obvious tear or ligamentous injury, no swelling, exam is mostly unremarkable.    Patient Instructions  Rash on face - begin over the counter hydrocortisone cream for up to a week. Let me know if this doesn't resolve  Rash on body - use the ringworm cream I prescribed.  It may take up to 2 weeks to resolve  Leg pain   Use ice such as bag of frozen peas for 20 minutes at a time up to 3 times daily for knee pain  Use the stretches we discussed for shin splint pain  Avoid deep bending, sprinting or jumping for the next 3- 5 days.    You can use the Naprosyn I sent to pharmacy for up to 2 weeks if needed   If worse or not  improving within a week, then let me know

## 2016-08-31 NOTE — Patient Instructions (Addendum)
Rash on face - begin over the counter hydrocortisone cream for up to a week. Let me know if this doesn't resolve  Rash on body - use the ringworm cream I prescribed.  It may take up to 2 weeks to resolve  Leg pain   Use ice such as bag of frozen peas for 20 minutes at a time up to 3 times daily for knee pain  Use the stretches we discussed for shin splint pain  Avoid deep bending, sprinting or jumping for the next 3- 5 days.    You can use the Naprosyn I sent to pharmacy for up to 2 weeks if needed   If worse or not improving within a week, then let me know

## 2016-09-02 ENCOUNTER — Ambulatory Visit (INDEPENDENT_AMBULATORY_CARE_PROVIDER_SITE_OTHER): Payer: BLUE CROSS/BLUE SHIELD | Admitting: Family Medicine

## 2016-09-02 ENCOUNTER — Encounter: Payer: Self-pay | Admitting: Family Medicine

## 2016-09-02 VITALS — BP 104/64 | HR 60 | Temp 98.2°F | Resp 16 | Wt 148.0 lb

## 2016-09-02 DIAGNOSIS — R112 Nausea with vomiting, unspecified: Secondary | ICD-10-CM | POA: Diagnosis not present

## 2016-09-02 MED ORDER — ONDANSETRON 4 MG PO TBDP: 4.0000 mg | ORAL_TABLET | Freq: Three times a day (TID) | ORAL | 0 refills | Status: DC | PRN

## 2016-09-02 NOTE — Patient Instructions (Signed)
You can take the medication for nausea and try to drink more fluids today. This may be a virus vs food related but the treatment is the same.  Call if you are not continuing to improve.  If you cannot keep fluids down and have decreased urine output then you may need to go to the emergency room for hydration but I do not expect this to happen.

## 2016-09-02 NOTE — Progress Notes (Signed)
   Subjective:    Patient ID: Ryan Russell, male    DOB: 07-04-1980, 36 y.o.   MRN: [id number]  HPI Chief Complaint  Patient presents with  . sick    nausated, throwing up 3 times yesterday, weak and soreness around sides and back. possible food posioning   He is here with complaints of a 36 hour history of nausea, vomiting. States he thinks he ate something that triggered this.  States he vomited 3 times since onset. Last time he vomited was yesterday afternoon.  States he has not been drinking many fluids. Continues to have nausea and generalized weakness. States he does feel some better.  Reports having normal bowel movement this morning.    Denies fever, chills, dizziness, neck pain, chest pain, palpitations, abdominal pain, back pain diarrhea.   Reviewed allergies, medications, past medical, surgical,  and social history.    Review of Systems Pertinent positives and negatives in the history of present illness.     Objective:   Physical Exam  Constitutional: He is oriented to person, place, and time. He appears well-developed and well-nourished. No distress.  HENT:  Mouth/Throat: Oropharynx is clear and moist.  Eyes: Conjunctivae are normal. Pupils are equal, round, and reactive to light.  Neck: Normal range of motion. Neck supple.  Cardiovascular: Normal rate, regular rhythm, normal heart sounds and intact distal pulses.   Pulmonary/Chest: Effort normal and breath sounds normal.  Abdominal: Soft. Bowel sounds are normal. He exhibits no distension. There is generalized tenderness. There is no rigidity, no rebound, no guarding, no CVA tenderness, no tenderness at McBurney's point and negative Murphy's sign.  Lymphadenopathy:    He has no cervical adenopathy.       Right: No supraclavicular adenopathy present.       Left: No supraclavicular adenopathy present.  Neurological: He is alert and oriented to person, place, and time. He has normal strength. No cranial nerve deficit  or sensory deficit. Gait normal.  Skin: Skin is warm and dry. No rash noted. He is not diaphoretic. No pallor.  Psychiatric: He has a normal mood and affect. His speech is normal and behavior is normal.   BP 104/64   Pulse 60   Temp 98.2 F (36.8 C) (Oral)   Resp 16   Wt 148 lb (67.1 kg)   SpO2 98%   BMI 20.64 kg/m       Assessment & Plan:  Nausea and vomiting in adult - Plan: ondansetron (ZOFRAN ODT) 4 MG disintegrating tablet  Discussed that it is unclear if this is related to food vs viral illness but treatment is the same. He appears to be improving. No sign of infection or dehydration at this point.  Will prescribe Zofran and have him increase fluids. He will call or return if worsening symptoms. He will go to the ED if cannot keep fluids down and has decreased urine output.

## 2016-09-07 ENCOUNTER — Other Ambulatory Visit: Payer: BLUE CROSS/BLUE SHIELD

## 2016-09-07 ENCOUNTER — Encounter: Payer: Self-pay | Admitting: Family Medicine

## 2016-09-07 ENCOUNTER — Telehealth: Payer: Self-pay | Admitting: Family Medicine

## 2016-09-07 NOTE — Telephone Encounter (Signed)
Pt called and stated that he was out Tuesday and Wednesday for stomach issues. He was given a note for Wednesday but he is requesting a note for Tuesday as well. Please advise.

## 2016-09-07 NOTE — Telephone Encounter (Signed)
Ok to give him a note for Tuesday. Thanks.

## 2016-09-25 ENCOUNTER — Ambulatory Visit (INDEPENDENT_AMBULATORY_CARE_PROVIDER_SITE_OTHER): Payer: BLUE CROSS/BLUE SHIELD | Admitting: Family Medicine

## 2016-09-25 ENCOUNTER — Encounter: Payer: Self-pay | Admitting: Family Medicine

## 2016-09-25 VITALS — BP 110/70 | HR 70 | Temp 97.9°F | Wt 151.6 lb

## 2016-09-25 DIAGNOSIS — R52 Pain, unspecified: Secondary | ICD-10-CM

## 2016-09-25 DIAGNOSIS — J029 Acute pharyngitis, unspecified: Secondary | ICD-10-CM | POA: Diagnosis not present

## 2016-09-25 DIAGNOSIS — R5383 Other fatigue: Secondary | ICD-10-CM | POA: Diagnosis not present

## 2016-09-25 LAB — TSH: TSH: 0.95 m[IU]/L (ref 0.40–4.50)

## 2016-09-25 NOTE — Patient Instructions (Signed)
Stay well hydrated, eat healthy foods and take tylenol or ibuprofen as needed for body aches, sore throat.    We will call you with your lab results.

## 2016-09-25 NOTE — Progress Notes (Signed)
Subjective: Chief Complaint  Patient presents with  . sore throat.    sore throat, stomach upset, tired for about 2 weeks     Ryan Russell is a 36 y.o. male who presents for evaluation of a 2 week history of intermittent sore throat.  He has not had a recent close exposure to someone with proven streptococcal pharyngitis.  Associated symptoms include fatigue and generalized back aches. These symptoms have also been intermittent and denies any symptoms today.   Treatment to date: none.  ? sick contacts.  No other aggravating or relieving factors.  No other c/o.  The following portions of the patient's history were reviewed and updated as appropriate: allergies, current medications, past medical history, past social history, past surgical history and problem list.  ROS as in subjective   Objective: Vitals:   09/25/16 1450  BP: 110/70  Pulse: 70  Temp: 97.9 F (36.6 C)    General appearance: no distress, WD/WN, well-appearing HEENT: normocephalic, conjunctiva/corneas normal, sclerae anicteric, nares patent, no discharge or erythema, pharynx with erythema, without edema or exudate.  Oral cavity: MMM, no lesions  Neck: supple, no lymphadenopathy, no thyromegaly Heart: RRR, normal S1, S2, no murmurs Lungs: CTA bilaterally, no wheezes, rhonchi, or rales Abdomen: +bs, soft, non tender, non distended, no masses, no hepatomegaly, no splenomegaly Extremities: no edema, normal pulses.    Laboratory Strep test not done. Results:N/A.    Assessment and Plan: Fatigue, unspecified type - Plan: CBC with Differential/Platelet, Comprehensive metabolic panel, VITAMIN D 25 Hydroxy (Vit-D Deficiency, Fractures), TSH  Acute pharyngitis, unspecified etiology  Body aches - Plan: CBC with Differential/Platelet, Comprehensive metabolic panel   Advised that symptoms and exam suggest a viral etiology. Does not currently have a sore throat.  Discussed symptomatic treatment including salt water gargles,  warm fluids, rest, hydrate well, can use over-the-counter Tylenol or Ibuprofen for throat pain, fever, or malaise. If worse or not improving within 2-3 days, call or return. We will call lab results.

## 2016-09-26 LAB — CBC WITH DIFFERENTIAL/PLATELET
BASOS ABS: 39 {cells}/uL (ref 0–200)
Basophils Relative: 1 %
EOS ABS: 78 {cells}/uL (ref 15–500)
Eosinophils Relative: 2 %
HEMATOCRIT: 46.6 % (ref 38.5–50.0)
HEMOGLOBIN: 15.1 g/dL (ref 13.2–17.1)
LYMPHS ABS: 2223 {cells}/uL (ref 850–3900)
Lymphocytes Relative: 57 %
MCH: 28.7 pg (ref 27.0–33.0)
MCHC: 32.4 g/dL (ref 32.0–36.0)
MCV: 88.6 fL (ref 80.0–100.0)
MONO ABS: 351 {cells}/uL (ref 200–950)
MPV: 10.2 fL (ref 7.5–12.5)
Monocytes Relative: 9 %
NEUTROS PCT: 31 %
Neutro Abs: 1209 cells/uL — ABNORMAL LOW (ref 1500–7800)
Platelets: 245 10*3/uL (ref 140–400)
RBC: 5.26 MIL/uL (ref 4.20–5.80)
RDW: 13.8 % (ref 11.0–15.0)
WBC: 3.9 10*3/uL — AB (ref 4.0–10.5)

## 2016-09-26 LAB — COMPREHENSIVE METABOLIC PANEL
ALBUMIN: 4.4 g/dL (ref 3.6–5.1)
ALK PHOS: 54 U/L (ref 40–115)
ALT: 37 U/L (ref 9–46)
AST: 28 U/L (ref 10–40)
BILIRUBIN TOTAL: 0.8 mg/dL (ref 0.2–1.2)
BUN: 12 mg/dL (ref 7–25)
CALCIUM: 9.9 mg/dL (ref 8.6–10.3)
CO2: 27 mmol/L (ref 20–31)
Chloride: 102 mmol/L (ref 98–110)
Creat: 1.07 mg/dL (ref 0.60–1.35)
Glucose, Bld: 83 mg/dL (ref 65–99)
Potassium: 4.6 mmol/L (ref 3.5–5.3)
Sodium: 137 mmol/L (ref 135–146)
Total Protein: 7.4 g/dL (ref 6.1–8.1)

## 2016-09-26 LAB — VITAMIN D 25 HYDROXY (VIT D DEFICIENCY, FRACTURES): Vit D, 25-Hydroxy: 16 ng/mL — ABNORMAL LOW (ref 30–100)

## 2016-09-30 ENCOUNTER — Other Ambulatory Visit: Payer: Self-pay | Admitting: Family Medicine

## 2016-09-30 MED ORDER — VITAMIN D (ERGOCALCIFEROL) 1.25 MG (50000 UNIT) PO CAPS: 50000.0000 [IU] | ORAL_CAPSULE | ORAL | 0 refills | Status: DC

## 2017-01-25 ENCOUNTER — Encounter: Payer: Self-pay | Admitting: Family Medicine

## 2017-01-25 ENCOUNTER — Ambulatory Visit (INDEPENDENT_AMBULATORY_CARE_PROVIDER_SITE_OTHER): Payer: BLUE CROSS/BLUE SHIELD | Admitting: Family Medicine

## 2017-01-25 VITALS — BP 120/72 | HR 64 | Temp 98.4°F | Wt 152.2 lb

## 2017-01-25 DIAGNOSIS — R1030 Lower abdominal pain, unspecified: Secondary | ICD-10-CM

## 2017-01-25 DIAGNOSIS — N50819 Testicular pain, unspecified: Secondary | ICD-10-CM | POA: Diagnosis not present

## 2017-01-25 LAB — POCT URINALYSIS DIP (PROADVANTAGE DEVICE)
BILIRUBIN UA: NEGATIVE
BILIRUBIN UA: NEGATIVE mg/dL
Blood, UA: NEGATIVE
GLUCOSE UA: NEGATIVE mg/dL
Nitrite, UA: NEGATIVE
Protein Ur, POC: NEGATIVE mg/dL
Specific Gravity, Urine: 1.03
Urobilinogen, Ur: NEGATIVE
pH, UA: 6 (ref 5.0–8.0)

## 2017-01-25 NOTE — Patient Instructions (Addendum)
If your testicle pain returns or you develop any new symptoms appear such as fever, urinary frequency, pain with urination or blood in your urine then let us know or seek medical attention right away.   We will call you with your lab results.    Testicular Self-Exam A self-exam of your testicles (testicular self-exam) is looking at and feeling your testicles for unusual lumps or swelling. Swelling, lumps, or pain can be caused by:  Injuries.  Puffiness, redness, and soreness (inflammation).  Infection.  Extra fluids around your testicle (hydrocele).  Twisted testicles (testicular torsion).  Cancer of the testicle (testicular cancer).  Why is it important to do a self-exam of testicles? You may need to do self-exams if you are at risk for cancer of the testicles. You may be at risk if you have:  A testicle that has not descended (cryptorchidism).  A history of cancer of the testicle.  A family history of cancer of the testicle.  How to do a self-exam of testicles It is easiest to do a self-exam after a warm bath or shower. Testicles are harder to examine when you are cold. A normal testicle is egg-shaped and feels firm. It is smooth, and it is not tender. At the back of your testicles, there is a firm cord that feels like spaghetti (spermatic cord). Look and feel for changes  Stand and hold your penis away from your body.  Look at each testicle to check for lumps or swelling.  Roll each testicle between your thumb and finger. Feel the whole testicle. Feel for: ? Lumps. ? Swelling. ? Discomfort.  Check for swelling or tender bumps in the groin area. Your groin is where your lower belly (abdomen) meets your upper thighs. Contact a health care provider if:  You find a bump or lump. This may be like a small, hard bump that is the size of a pea.  You find swelling.  You find pain.  You find soreness.  You see or feel any other changes. Summary  A self-exam of your  testicles is looking at and feeling your testicles for lumps or swelling.  You may need to do self-exams if you are at risk for cancer of the testicle.  You should check each of your testicles for lumps, swelling, or discomfort.  You should check for swelling or tender bumps in the groin area. Your groin is where your lower belly (abdomen) meets your upper thighs. This information is not intended to replace advice given to you by your health care provider. Make sure you discuss any questions you have with your health care provider. Document Released: 07/17/2008 Document Revised: 03/16/2016 Document Reviewed: 03/16/2016 Elsevier Interactive Patient Education  2017 [employer].

## 2017-01-25 NOTE — Progress Notes (Signed)
   Subjective:    Patient ID: Ryan Russell, male    DOB: 10/04/1980, 36 y.o.   MRN: [id number]  HPI Chief Complaint  Patient presents with  . testicular    testicular pain- 2 days ago. eased up in pain but still off and on   He is here with complaints of intermittent testicular pain for the past 2 days. Reports pain started initially in his lower abdomen and abdominal pain has since resolved. States his pain overall is at least 70% improved.  States his left testicle was slightly tender but not today. Denies pain today.   He questions whether his symptoms may be related to stress.  Reports having a normal bowel movement yesterday. No blood in stool.   Denies injury. No recent sexual activity.  No urinary frequency, urgency or dysuria. No back pain. No saddle anesthesia.   Denies fever, chills, nausea, vomiting, diarrhea.   Reviewed allergies, medications, past medical, surgical, and social history.   Review of Systems Pertinent positives and negatives in the history of present illness.     Objective:   Physical Exam  Constitutional: He appears well-developed and well-nourished. No distress.  Cardiovascular: Normal rate, regular rhythm and normal heart sounds.   Pulmonary/Chest: Effort normal and breath sounds normal.  Abdominal: Soft. Bowel sounds are normal. He exhibits no distension. There is no hepatosplenomegaly. There is no tenderness. There is no rigidity, no rebound, no guarding, no CVA tenderness, no tenderness at McBurney's point and negative Murphy's sign. Hernia confirmed negative in the right inguinal area and confirmed negative in the left inguinal area.  Genitourinary: Testes normal and penis normal. Cremasteric reflex is present. Right testis shows no tenderness. Left testis shows no tenderness. Circumcised.  Lymphadenopathy:       Right: No inguinal adenopathy present.       Left: No inguinal adenopathy present.  Skin: Skin is warm and dry. No rash noted. No  pallor.  Psychiatric: He has a normal mood and affect. His speech is normal and behavior is normal.   BP 120/72   Pulse 64   Temp 98.4 F (36.9 C) (Oral)   Wt 152 lb 3.2 oz (69 kg)   BMI 21.23 kg/m       Assessment & Plan:  Testicular pain - Plan: POCT Urinalysis DIP (Proadvantage Device), HIV antibody, RPR, C. trachomatis/N. gonorrhoeae RNA, Urine Culture  Lower abdominal pain  Discussed that his symptoms being intermittent and 70% improved speak to this being nothing serious or life threatening.  His UA shows a trace of leuk but otherwise negative and he denies urinary symptoms. He verbalized understanding that if urinary symptoms arise then he should call or return.  Will send his urine for culture, check HIV, RPR, GC/CT.  Advised of red flags of testicular pain and when to seek immediate medical care.

## 2017-01-26 LAB — C. TRACHOMATIS/N. GONORRHOEAE RNA
C. trachomatis RNA, TMA: NOT DETECTED
N. GONORRHOEAE RNA, TMA: NOT DETECTED

## 2017-01-26 LAB — URINE CULTURE
MICRO NUMBER: 81054227
Result:: NO GROWTH
SPECIMEN QUALITY: ADEQUATE

## 2017-01-26 LAB — RPR: RPR: NONREACTIVE

## 2017-01-26 LAB — HIV ANTIBODY (ROUTINE TESTING W REFLEX): HIV 1&2 Ab, 4th Generation: NONREACTIVE

## 2017-01-29 ENCOUNTER — Telehealth: Payer: Self-pay | Admitting: Family Medicine

## 2017-01-29 NOTE — Telephone Encounter (Signed)
Patient wanted to know if any of the testing that was done, would show IBS

## 2017-01-29 NOTE — Telephone Encounter (Signed)
Pt was notified and will think about it before he he makes an appt

## 2017-01-29 NOTE — Telephone Encounter (Signed)
No. I recommend he follow up with Vincenza Hews, his PCP for further evaluation for this. Typically IBS is a diagnosis of exclusion.

## 2017-03-01 ENCOUNTER — Ambulatory Visit (INDEPENDENT_AMBULATORY_CARE_PROVIDER_SITE_OTHER): Payer: BLUE CROSS/BLUE SHIELD | Admitting: Family Medicine

## 2017-03-01 ENCOUNTER — Encounter: Payer: Self-pay | Admitting: Family Medicine

## 2017-03-01 VITALS — BP 122/80 | HR 68 | Temp 97.4°F | Ht 71.0 in | Wt 154.6 lb

## 2017-03-01 DIAGNOSIS — R519 Headache, unspecified: Secondary | ICD-10-CM

## 2017-03-01 DIAGNOSIS — R51 Headache: Secondary | ICD-10-CM | POA: Diagnosis not present

## 2017-03-01 NOTE — Patient Instructions (Signed)
Try ice versus heat across your forehead and temples (use whichever seems most effective). Try and drink plenty of water and get some sleep. Avoid excessive caffeine. If it feels like it is turning into a migraine, have some caffeine. Try and work on stress reduction and relaxation techniques.  Avoid clenching or grinding your teeth, avoid gum chewing (when you have a headache).  Go home and take 2 Aleve along with some food.  You can take another dose 12 hours later (only 4/day); you may use tylenol in between if your headache doesn't completely go away.  If you develop fever, numbness, tingling, slurred speech, chest pain or other symptoms that are neurologic or cardiac, please seek immediate care   General Headache Without Cause A headache is pain or discomfort felt around the head or neck area. The specific cause of a headache may not be found. There are many causes and types of headaches. A few common ones are:  Tension headaches.  Migraine headaches.  Cluster headaches.  Chronic daily headaches.  Follow these instructions at home: Watch your condition for any changes. Take these steps to help with your condition: Managing pain  Take over-the-counter and prescription medicines only as told by your health care provider.  Lie down in a dark, quiet room when you have a headache.  If directed, apply ice to the head and neck area: ? Put ice in a plastic bag. ? Place a towel between your skin and the bag. ? Leave the ice on for 20 minutes, 2-3 times per day.  Use a heating pad or hot shower to apply heat to the head and neck area as told by your health care provider.  Keep lights dim if bright lights bother you or make your headaches worse. Eating and drinking  Eat meals on a regular schedule.  Limit alcohol use.  Decrease the amount of caffeine you drink, or stop drinking caffeine. General instructions  Keep all follow-up visits as told by your health care provider.  This is important.  Keep a headache journal to help find out what may trigger your headaches. For example, write down: ? What you eat and drink. ? How much sleep you get. ? Any change to your diet or medicines.  Try massage or other relaxation techniques.  Limit stress.  Sit up straight, and do not tense your muscles.  Do not use tobacco products, including cigarettes, chewing tobacco, or e-cigarettes. If you need help quitting, ask your health care provider.  Exercise regularly as told by your health care provider.  Sleep on a regular schedule. Get 7-9 hours of sleep, or the amount recommended by your health care provider. Contact a health care provider if:  Your symptoms are not helped by medicine.  You have a headache that is different from the usual headache.  You have nausea or you vomit.  You have a fever. Get help right away if:  Your headache becomes severe.  You have repeated vomiting.  You have a stiff neck.  You have a loss of vision.  You have problems with speech.  You have pain in the eye or ear.  You have muscular weakness or loss of muscle control.  You lose your balance or have trouble walking.  You feel faint or pass out.  You have confusion. This information is not intended to replace advice given to you by your health care provider. Make sure you discuss any questions you have with your health care provider. Document  Released: 04/20/2005 Document Revised: 09/26/2015 Document Reviewed: 08/13/2014 Elsevier Interactive Patient Education  2017 [employer].

## 2017-03-01 NOTE — Progress Notes (Signed)
Chief Complaint  Patient presents with  . Facial Pain    sinus pain and pressure, HA that started last night and today. Drainage, no discolored mucus.     Started with a headache after his show last night. It was at his left temple, and "kind of all over", moved around. He took tylenol, helped some. When he woke up this morning, he had another headache.  He now has a headache across his forehead and at both temples.  Was around loud music last night.  He is very tired today, didn't sleep much.  Thinks that may be contributing; also hasn't had much water to drink. No known grinding/clenching.  Denies allergy symptoms--some sneezing last week. No itchy eyes, cough.  Some throat-clearing, denies sore throat or cough.  He was taking supplements with caffeine, only for a few weeks, not recently, sporadic.  No other regular caffeine use or withdrawal.  Took aleve last night, tylenol this morning.  Current pain 5-6/10  Admits to a lot of stressors regarding work, relationship--suspects that is contributing to his headache  PMH, PSH, SH reviewed Minimal passive tobacco exposure  Outpatient Encounter Prescriptions as of 03/01/2017  Medication Sig  . acetaminophen (TYLENOL) 325 MG tablet Take 325 mg by mouth every 6 (six) hours as needed.  . [DISCONTINUED] naproxen (NAPROSYN) 500 MG tablet Take 1 tablet (500 mg total) by mouth 2 (two) times daily with a meal.   No facility-administered encounter medications on file as of 03/01/2017.    ROS: No known fever, chills, nausea (some last week, resolved), vomiting.  Slight heaviness in his stomach today. No diarrhea.  No bleeding, bruising, rash.  No known tick bites. No numbness, tingling, weakness, tremor, chest pain, palpitations. +stress, fatigue  PHYSICAL EXAM:  BP 122/80   Pulse 68   Temp (!) 97.4 F (36.3 C) (Tympanic)   Ht 5\' 11"  (1.803 m)   Wt 154 lb 9.6 oz (70.1 kg)   BMI 21.56 kg/m   Very tired appearing male, in otherwise good  spirits. Rubbing foreheard and temples HEENT: PERRL, EOMI, conjunctiva and sclera are clear. TM's and EAC's normal.  Nasal mucosa with mild edema, no erythema or drainage. Mildly tender at both temples and slightly over both maxillary sinuses. OP is clear Neck: no lymphadenopathy, thyromegaly or mass Heart: regular rate and rhythm Lungs: clear bilaterally Neuro: alert and oriented, cranial nerves intact, normal strength, gait Psych: normal mood (stressed/tired per pt), normal affect, hygiene and grooming.    ASSESSMENT/PLAN:  Acute nonintractable headache, unspecified headache type - suspect tension/stress/muscular. Possible mild sinus component; no evidence of infection. Supportive measures and red flags reviewed  Declines flu shot today.  Try ice versus heat across your forehead and temples (use whichever seems most effective). Try and drink plenty of water and get some sleep. Avoid excessive caffeine. If it feels like it is turning into a migraine, have some caffeine. Try and work on stress reduction and relaxation techniques.  Avoid clenching or grinding your teeth, avoid gum chewing (when you have a headache).  Go home and take 2 Aleve along with some food.  You can take another dose 12 hours later (only 4/day); you may use tylenol in between if your headache doesn't completely go away.  If you develop fever, numbness, tingling, slurred speech, chest pain or other symptoms that are neurologic or cardiac, please seek immediate care

## 2017-04-19 ENCOUNTER — Other Ambulatory Visit: Payer: Self-pay

## 2017-04-19 ENCOUNTER — Ambulatory Visit: Payer: BLUE CROSS/BLUE SHIELD | Admitting: Medical

## 2017-04-19 ENCOUNTER — Ambulatory Visit (HOSPITAL_COMMUNITY)
Admission: EM | Admit: 2017-04-19 | Discharge: 2017-04-19 | Disposition: A | Discharge status: Home / Self Care | Payer: BLUE CROSS/BLUE SHIELD | Attending: Family Medicine | Admitting: Family Medicine

## 2017-04-19 ENCOUNTER — Encounter (HOSPITAL_COMMUNITY): Payer: Self-pay | Admitting: *Deleted

## 2017-04-19 DIAGNOSIS — S60512A Abrasion of left hand, initial encounter: Secondary | ICD-10-CM

## 2017-04-19 MED ORDER — CEPHALEXIN 500 MG PO CAPS: 500.0000 mg | ORAL_CAPSULE | Freq: Four times a day (QID) | ORAL | 0 refills | Status: AC

## 2017-04-19 NOTE — ED Triage Notes (Signed)
Per pt he is having finger pain, per pt it is his middle finger on his left hand, per pt he was picking up a box,

## 2017-04-19 NOTE — ED Provider Notes (Signed)
[facility]    CSN: [id number] Arrival date & time: 04/19/17  1211     History   Chief Complaint No chief complaint on file.   HPI Ryan Russell is a 36 y.o. male presenting with concern of infection to a scrape on his left middle finger. He scratched his finger on a couch spring last week, cleaned with hydrogen peroxide. In the past couple days he had noticed increased pain that is off and on as well as darkening to the skin. Endorses no limitation with movement. No history of diabetes.   HPI  Past Medical History:  Diagnosis Date  . Allergy   . Anxiety    intermittent  . GERD (gastroesophageal reflux disease)   . Hearing loss 2014   mild, high frequency, audiology eval   . History of back pain    prior physical therapy  . Leukopenia    Hematology eval 2012, Dr. Dalene Carrowdogwu, felt to be ethnicity related  . Wears glasses     Patient Active Problem List   Diagnosis Date Noted  . Hemorrhoids, external 07/06/2011  . Tinnitus 05/11/2011  . Dizziness 05/11/2011  . URI (upper respiratory infection) 05/11/2011  . GERD (gastroesophageal reflux disease) 11/07/2010  . Allergic rhinitis, cause unspecified 11/07/2010  . DIZZINESS 09/05/2008  . CHEST PAIN-UNSPECIFIED 09/05/2008  . BRADYCARDIA 08/23/2008  . SYNCOPE AND COLLAPSE 08/23/2008    History reviewed. No pertinent surgical history.     Home Medications    Prior to Admission medications   Medication Sig Start Date End Date Taking? Authorizing Provider  acetaminophen (TYLENOL) 325 MG tablet Take 325 mg by mouth every 6 (six) hours as needed.    [provider]  cephALEXin (KEFLEX) 500 MG capsule Take 1 capsule (500 mg total) by mouth 4 (four) times daily for 5 days. 04/19/17 04/24/17  Wieters, Junius CreamerHallie C, PA-C    Family History Family History  Problem Relation Age of Onset  . Cancer Mother        liver  . Cancer Father        ? possibly colon  . Fibromyalgia Sister   . Diabetes Other   .  Hypertension Other   . Heart disease Neg Hx     Social History Social History   Tobacco Use  . Smoking status: Passive Smoke Exposure - Never Smoker  . Smokeless tobacco: Never Used  Substance Use Topics  . Alcohol use: No  . Drug use: No     Allergies   Patient has no known allergies.   Review of Systems Review of Systems  Constitutional: Negative for fatigue and fever.  Musculoskeletal: Positive for arthralgias. Negative for joint swelling.  Skin: Positive for color change and wound. Negative for rash.  Neurological: Negative for dizziness, light-headedness and headaches.     Physical Exam Triage Vital Signs ED Triage Vitals  Enc Vitals Group     BP 04/19/17 1321 97/64     Pulse Rate 04/19/17 1321 73     Resp --      Temp 04/19/17 1321 98 F (36.7 C)     Temp Source 04/19/17 1321 Oral     SpO2 04/19/17 1321 100 %     Weight --      Height --      Head Circumference --      Peak Flow --      Pain Score 04/19/17 1320 4     Pain Loc --      Pain  Edu? --      Excl. in GC? --    No data found.  Updated Vital Signs BP 97/64 (BP Location: Left Arm)   Pulse 73   Temp 98 F (36.7 C) (Oral)   SpO2 100%   Physical Exam  Constitutional: He is oriented to person, place, and time. He appears well-developed and well-nourished. No distress.  HENT:  Head: Normocephalic and atraumatic.  Eyes: Pupils are equal, round, and reactive to light.  Neck: Normal range of motion.  Cardiovascular: Normal rate.  Pulmonary/Chest: Effort normal. No respiratory distress.  Musculoskeletal:  1 cm linear scratch to middle left finger, with surrounding hyperpigmentation, scabbing present over lesion. Mild tenderness to palpation of finger. Full ROM without pain.  Neurological: He is alert and oriented to person, place, and time.     UC Treatments / Results  Labs (all labs ordered are listed, but only abnormal results are displayed) Labs Reviewed - No data to display  EKG   EKG Interpretation None       Radiology No results found.  Procedures Procedures (including critical care time)  Medications Ordered in UC Medications - No data to display   Initial Impression / Assessment and Plan / UC Course  I have reviewed the triage vital signs and the nursing notes.  Pertinent labs & imaging results that were available during my care of the patient were reviewed by me and considered in my medical decision making (see chart for details).     Abrasion appears to be healing well, given 5 days of keflex for possible infection as pain and skin changes developing later in course of healing. Expect area to continue healing and pain to resolve.   Final Clinical Impressions(s) / UC Diagnoses   Final diagnoses:  Scratch of hand, left, initial encounter    ED Discharge Orders        Ordered    cephALEXin (KEFLEX) 500 MG capsule  4 times daily     04/19/17 1353       Controlled Substance Prescriptions [place] Controlled Substance Registry consulted? Not Applicable   Lew DawesWieters, Hallie C, [place] JerseyPA-C 04/19/17 1544

## 2017-04-19 NOTE — Discharge Instructions (Signed)
Keflex prescribed for infection.  Scratch appears to be healing well.   Please keep area clean.  Return if pain increasing, redness spreading, development of any drainage.

## 2017-09-17 ENCOUNTER — Encounter: Payer: Self-pay | Admitting: Family Medicine

## 2017-09-17 ENCOUNTER — Ambulatory Visit (INDEPENDENT_AMBULATORY_CARE_PROVIDER_SITE_OTHER): Payer: BLUE CROSS/BLUE SHIELD | Admitting: Family Medicine

## 2017-09-17 VITALS — BP 108/72 | HR 61 | Temp 97.7°F | Ht 71.0 in | Wt 142.6 lb

## 2017-09-17 DIAGNOSIS — Z0000 Encounter for general adult medical examination without abnormal findings: Secondary | ICD-10-CM | POA: Diagnosis not present

## 2017-09-17 NOTE — Progress Notes (Signed)
   Subjective:    Patient ID: Ryan Russell, male    DOB: [birth year], 37 y.o.   MRN: [id number]  HPI He is here for consult concerning getting an accommodation for work so he can keep his beard.  When I first asked him that question, he stated that he liked his beard.  When I explained to him that I needed a medical reason.  He states that he might have in the past had some difficulty with shaving.   Review of Systems     Objective:   Physical Exam Alert and in no distress.  He does have a short beard but no evidence of folliculitis was noted.  No other facial lesions were noted.       Assessment & Plan:  Normal exam I explained that since he does not have any issues with his face in regard to folliculitis or folliculitis bye-bye, I cannot give him an accommodation since there is no medical reason.

## 2017-10-05 ENCOUNTER — Encounter: Payer: Self-pay | Admitting: Family Medicine

## 2017-10-18 ENCOUNTER — Encounter: Payer: Self-pay | Admitting: Family Medicine

## 2017-10-18 ENCOUNTER — Ambulatory Visit (INDEPENDENT_AMBULATORY_CARE_PROVIDER_SITE_OTHER): Payer: BLUE CROSS/BLUE SHIELD | Admitting: Family Medicine

## 2017-10-18 VITALS — BP 106/74 | HR 58 | Temp 97.5°F | Ht 71.0 in | Wt 151.4 lb

## 2017-10-18 DIAGNOSIS — J309 Allergic rhinitis, unspecified: Secondary | ICD-10-CM

## 2017-10-18 DIAGNOSIS — K219 Gastro-esophageal reflux disease without esophagitis: Secondary | ICD-10-CM | POA: Diagnosis not present

## 2017-10-18 DIAGNOSIS — Z209 Contact with and (suspected) exposure to unspecified communicable disease: Secondary | ICD-10-CM

## 2017-10-18 DIAGNOSIS — Z0000 Encounter for general adult medical examination without abnormal findings: Secondary | ICD-10-CM | POA: Diagnosis not present

## 2017-10-18 DIAGNOSIS — K644 Residual hemorrhoidal skin tags: Secondary | ICD-10-CM | POA: Diagnosis not present

## 2017-10-18 LAB — POCT URINALYSIS DIP (PROADVANTAGE DEVICE)
BILIRUBIN UA: NEGATIVE mg/dL
Bilirubin, UA: NEGATIVE
Glucose, UA: NEGATIVE mg/dL
Leukocytes, UA: NEGATIVE
Nitrite, UA: NEGATIVE
PH UA: 6.5 (ref 5.0–8.0)
PROTEIN UA: NEGATIVE mg/dL
RBC UA: NEGATIVE
SPECIFIC GRAVITY, URINE: 1.01
Urobilinogen, Ur: 3.5

## 2017-10-18 NOTE — Addendum Note (Signed)
Addended by: Renelda LomaHENRY, Aarsh Fristoe on: 10/18/2017 09:12 AM   Modules accepted: Orders

## 2017-10-18 NOTE — Progress Notes (Signed)
   Subjective:    Patient ID: Ryan Russell, male    DOB: 04-13-1981, 37 y.o.   MRN: [id number]  HPI He is here for complete examination.  He does have concerns over cancer but does not have any information on family history of any particular type of cancer.  He has a previous history of allergic rhinitis as well as reflux disease but is having no difficulty with that.  He also has a previous history of hemorrhoids and again not complaining of any issues with that.  He does have concerns over STD exposure.  This dates back several years ago and also within the last several weeks with unprotected sex.  He otherwise has no particular concerns or complaints.  Family and social history as well as health maintenance and immunizations was reviewed.   Review of Systems  All other systems reviewed and are negative.      Objective:   Physical Exam BP 106/74 (BP Location: Left Arm, Patient Position: Sitting)   Pulse (!) 58   Temp (!) 97.5 F (36.4 C)   Ht 5\' 11"  (1.803 m)   Wt 151 lb 6.4 oz (68.7 kg)   SpO2 98%   BMI 21.12 kg/m   General Appearance:    Alert, cooperative, no distress, appears stated age  Head:    Normocephalic, without obvious abnormality, atraumatic  Eyes:    PERRL, conjunctiva/corneas clear, EOM's intact, fundi    benign  Ears:    Normal TM's and external ear canals  Nose:   Nares normal, mucosa normal, no drainage or sinus   tenderness  Throat:   Lips, mucosa, and tongue normal; teeth and gums normal  Neck:   Supple, no lymphadenopathy;  thyroid:  no   enlargement/tenderness/nodules; no carotid   bruit or JVD     Lungs:     Clear to auscultation bilaterally without wheezes, rales or     ronchi; respirations unlabored      Heart:    Regular rate and rhythm, S1 and S2 normal, no murmur, rub   or gallop     Abdomen:     Soft, non-tender, nondistended, normoactive bowel sounds,    no masses, no hepatosplenomegaly  Genitalia:    Normal male external genitalia without  lesions.  Testicles without masses.  No inguinal hernias.  Rectal:   Deferred due to age <40 and lack of symptoms  Extremities:   No clubbing, cyanosis or edema  Pulses:   2+ and symmetric all extremities  Skin:   Skin color, texture, turgor normal, no rashes or lesions  Lymph nodes:   Cervical, supraclavicular, and axillary nodes normal  Neurologic:   CNII-XII intact, normal strength, sensation and gait; reflexes 2+ and symmetric throughout          Psych:   Normal mood, affect, hygiene and grooming.          Assessment & Plan:  Routine general medical examination at a health care facility - Plan: CBC with Differential/Platelet, Comprehensive metabolic panel, Lipid panel  Allergic rhinitis, unspecified seasonality, unspecified trigger  Gastroesophageal reflux disease without esophagitis  Hemorrhoids, external  Contact with or exposure to communicable disease - Plan: HIV antibody, RPR, GC/Chlamydia Probe Amp Did recommend he check with his family to see if there is a history of any particular type of colon cancer.  He will let me know what he finds.

## 2017-10-19 LAB — RPR: RPR: NONREACTIVE

## 2017-10-19 LAB — CBC WITH DIFFERENTIAL/PLATELET
BASOS: 0 %
Basophils Absolute: 0 10*3/uL (ref 0.0–0.2)
EOS (ABSOLUTE): 0.1 10*3/uL (ref 0.0–0.4)
EOS: 3 %
HEMATOCRIT: 47.4 % (ref 37.5–51.0)
HEMOGLOBIN: 16.1 g/dL (ref 13.0–17.7)
Immature Grans (Abs): 0 10*3/uL (ref 0.0–0.1)
Immature Granulocytes: 0 %
LYMPHS ABS: 2 10*3/uL (ref 0.7–3.1)
Lymphs: 49 %
MCH: 29.9 pg (ref 26.6–33.0)
MCHC: 34 g/dL (ref 31.5–35.7)
MCV: 88 fL (ref 79–97)
MONOCYTES: 10 %
Monocytes Absolute: 0.4 10*3/uL (ref 0.1–0.9)
Neutrophils Absolute: 1.6 10*3/uL (ref 1.4–7.0)
Neutrophils: 38 %
Platelets: 219 10*3/uL (ref 150–450)
RBC: 5.38 x10E6/uL (ref 4.14–5.80)
RDW: 13.5 % (ref 12.3–15.4)
WBC: 4.2 10*3/uL (ref 3.4–10.8)

## 2017-10-19 LAB — COMPREHENSIVE METABOLIC PANEL
A/G RATIO: 1.7 (ref 1.2–2.2)
ALBUMIN: 4.7 g/dL (ref 3.5–5.5)
ALT: 18 IU/L (ref 0–44)
AST: 22 IU/L (ref 0–40)
Alkaline Phosphatase: 51 IU/L (ref 39–117)
BUN / CREAT RATIO: 10 (ref 9–20)
BUN: 9 mg/dL (ref 6–20)
Bilirubin Total: 0.9 mg/dL (ref 0.0–1.2)
CALCIUM: 9.7 mg/dL (ref 8.7–10.2)
CO2: 25 mmol/L (ref 20–29)
Chloride: 101 mmol/L (ref 96–106)
Creatinine, Ser: 0.86 mg/dL (ref 0.76–1.27)
GFR calc Af Amer: 129 mL/min/{1.73_m2} (ref >59)
GFR, EST NON AFRICAN AMERICAN: 112 mL/min/{1.73_m2} (ref >59)
GLOBULIN, TOTAL: 2.8 g/dL (ref 1.5–4.5)
Glucose: 83 mg/dL (ref 65–99)
Potassium: 4.1 mmol/L (ref 3.5–5.2)
SODIUM: 140 mmol/L (ref 134–144)
Total Protein: 7.5 g/dL (ref 6.0–8.5)

## 2017-10-19 LAB — LIPID PANEL
CHOL/HDL RATIO: 2.7 ratio (ref 0.0–5.0)
Cholesterol, Total: 160 mg/dL (ref 100–199)
HDL: 60 mg/dL (ref >39)
LDL CALC: 90 mg/dL (ref 0–99)
Triglycerides: 52 mg/dL (ref 0–149)
VLDL Cholesterol Cal: 10 mg/dL (ref 5–40)

## 2017-10-19 LAB — HIV ANTIBODY (ROUTINE TESTING W REFLEX): HIV SCREEN 4TH GENERATION: NONREACTIVE

## 2017-10-20 LAB — GC/CHLAMYDIA PROBE AMP
CHLAMYDIA, DNA PROBE: NEGATIVE
NEISSERIA GONORRHOEAE BY PCR: NEGATIVE

## 2017-11-05 ENCOUNTER — Encounter: Payer: BLUE CROSS/BLUE SHIELD | Admitting: Family Medicine

## 2018-04-28 ENCOUNTER — Ambulatory Visit: Payer: BLUE CROSS/BLUE SHIELD | Admitting: Family Medicine

## 2018-04-29 ENCOUNTER — Encounter: Payer: Self-pay | Admitting: Family Medicine

## 2018-04-29 ENCOUNTER — Ambulatory Visit: Payer: BLUE CROSS/BLUE SHIELD | Admitting: Family Medicine

## 2018-04-29 VITALS — BP 112/80 | HR 73 | Temp 98.1°F | Wt 146.0 lb

## 2018-04-29 DIAGNOSIS — M898X8 Other specified disorders of bone, other site: Secondary | ICD-10-CM

## 2018-04-29 NOTE — Patient Instructions (Signed)
Heat to the area for 20 minutes times per day 3 times per day.  Take 2 Aleve twice per day and do this for the next 10 days or so

## 2018-04-29 NOTE — Progress Notes (Signed)
   Subjective:    Patient ID: Tresa Garteryson L Koskinen, male    DOB: 10-Apr-1981, 37 y.o.   MRN: [id number]  HPI He complains of a 3-week history of right hip pain that is intermittent in nature and usually is made worse with certain hip motions.  He points to the right iliac crest.   Review of Systems     Objective:   Physical Exam Exam of the right iliac crest area shows no lesions and only minimal tenderness over the crest.  Hip motion did cause some discomfort in that area however passive motion was less uncomfortable.       Assessment & Plan:  Iliac crest bone pain Pain that I thought this was mainly musculoskeletal in nature.  Recommend conservative care with heat and anti-inflammatory.  He will call if further difficulty

## 2018-05-27 IMAGING — CR DG HAND COMPLETE 3+V*L*
3 series · 3 of 3 positions shown · non-contrast
Comparison: None.

CLINICAL DATA: Pain in the third through fifth metacarpals, status
post injury.

EXAM:
LEFT HAND - COMPLETE 3+ VIEW

[view not recorded (1 of 3)]
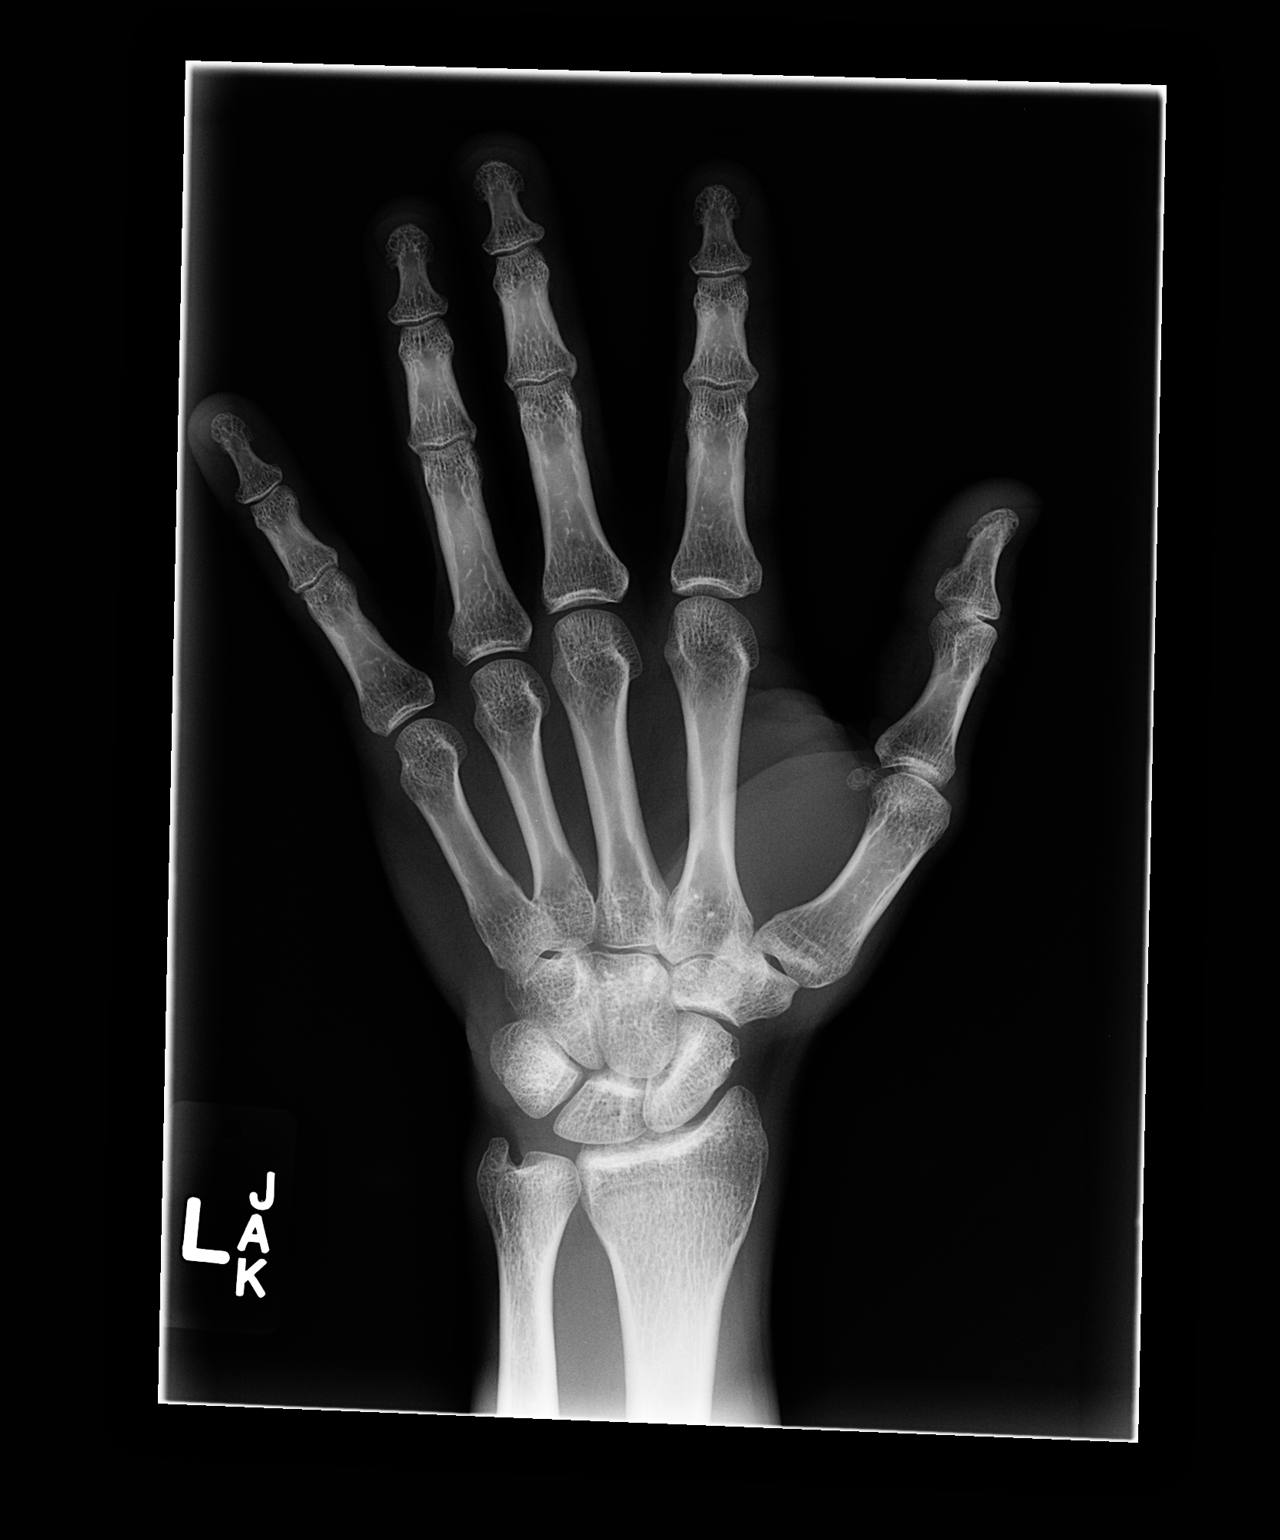

[view not recorded (2 of 3)]
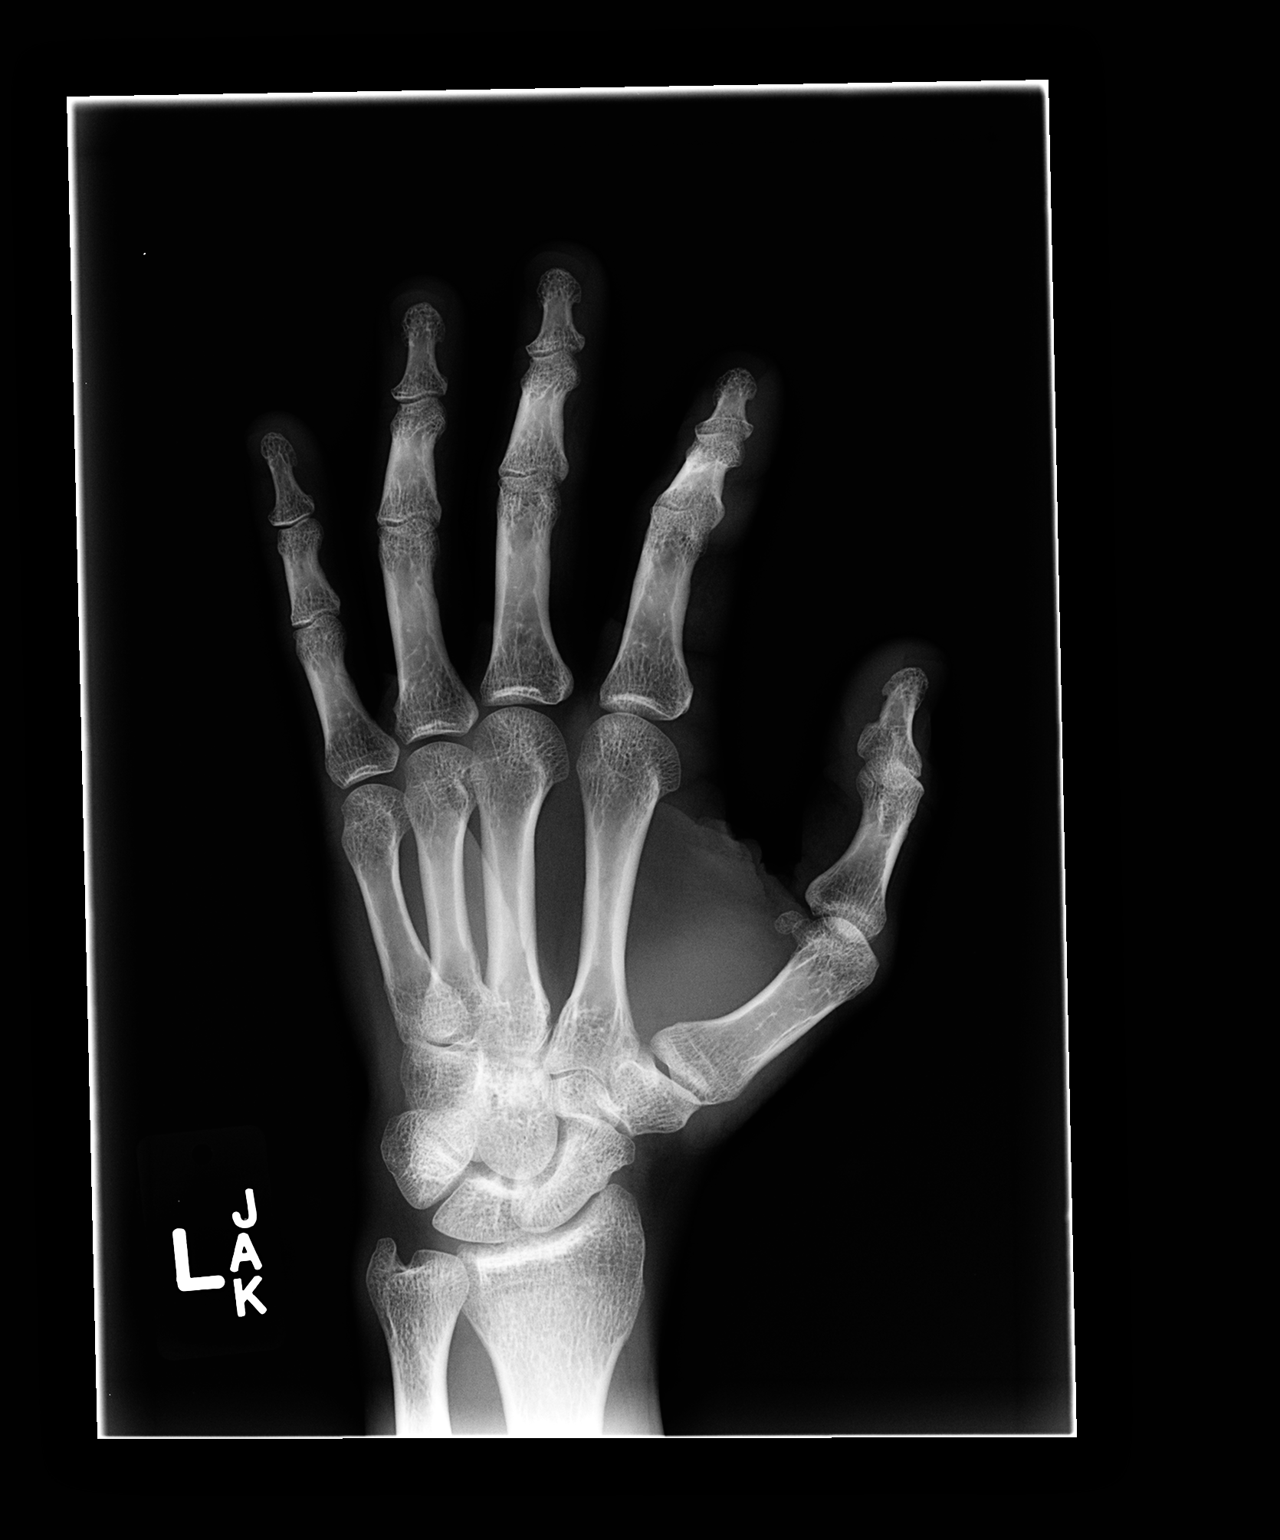

[view not recorded (3 of 3)]
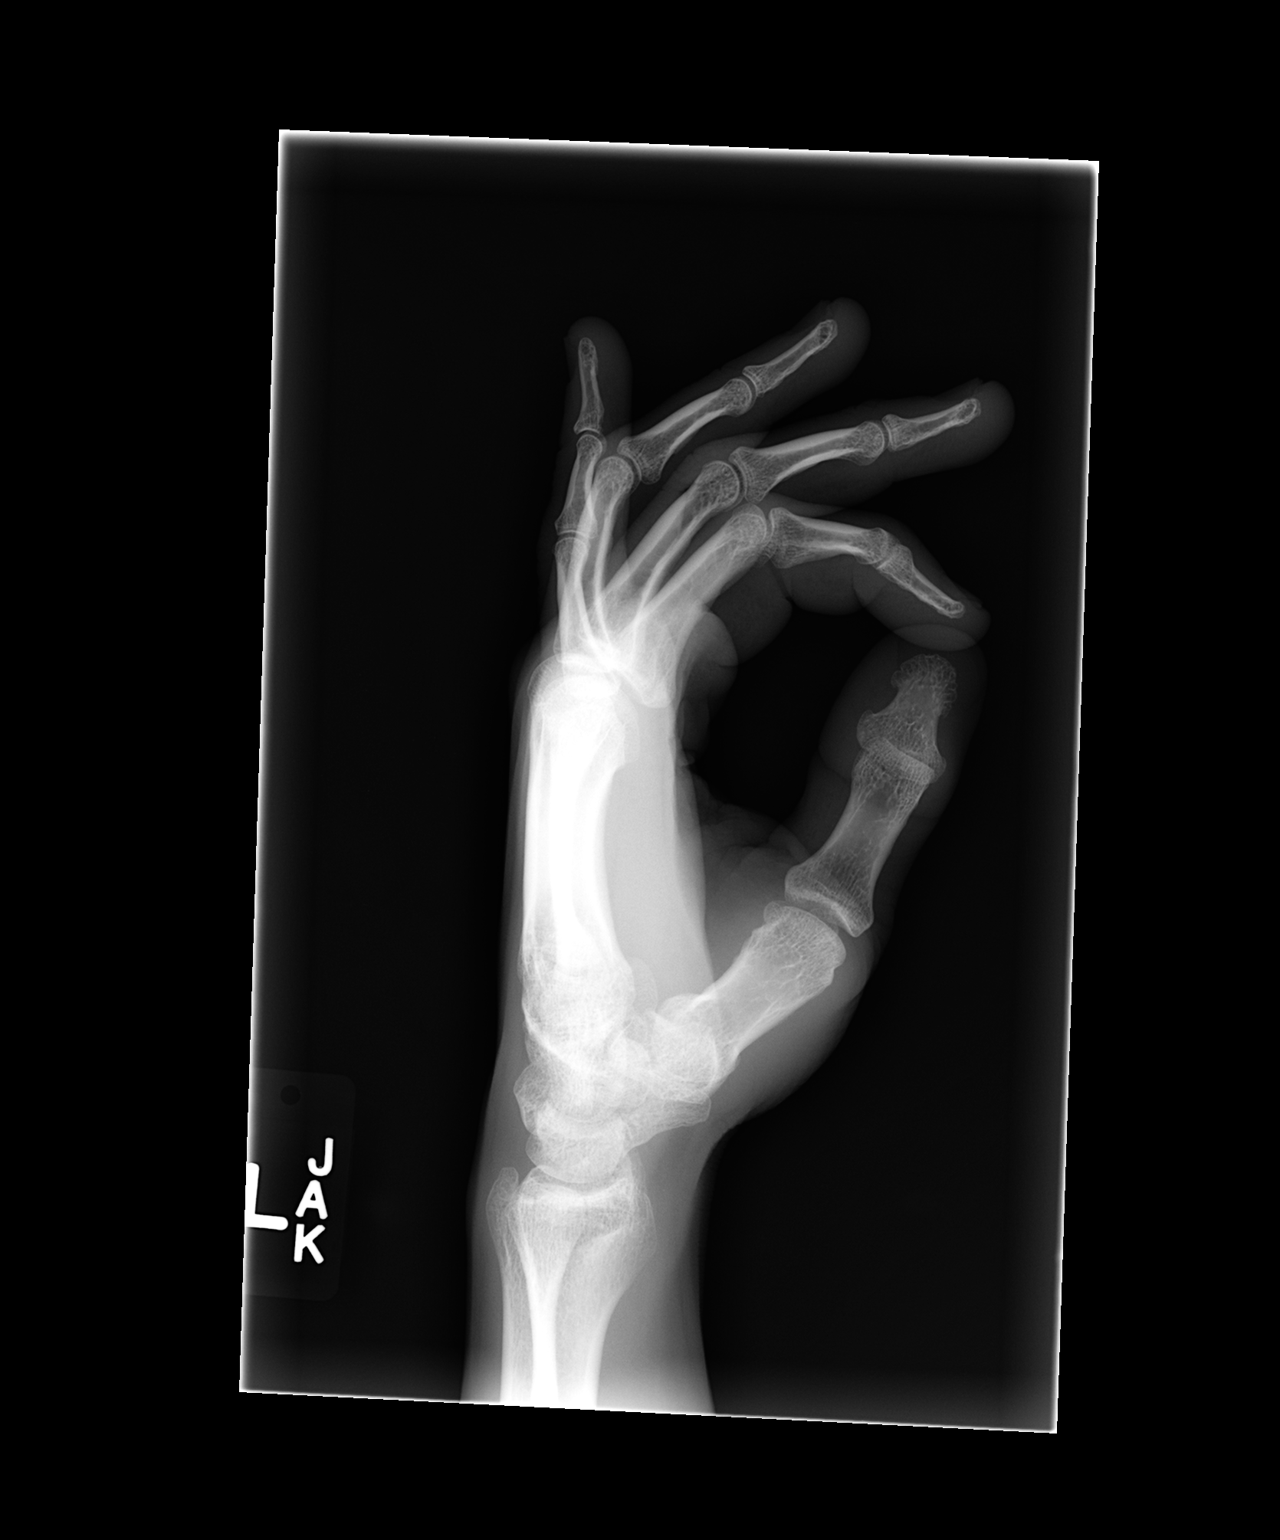

[3 of 3 positions shown; findings below may reference images not displayed]

FINDINGS: There is no evidence of fracture or dislocation. There is no
evidence of arthropathy or other focal bone abnormality. Soft
tissues are unremarkable.
IMPRESSION: Negative.

## 2018-10-20 ENCOUNTER — Encounter: Payer: BLUE CROSS/BLUE SHIELD | Admitting: Family Medicine

## 2018-12-28 ENCOUNTER — Encounter: Payer: Self-pay | Admitting: Family Medicine

## 2020-11-17 ENCOUNTER — Emergency Department (HOSPITAL_BASED_OUTPATIENT_CLINIC_OR_DEPARTMENT_OTHER): Payer: BC Managed Care – PPO | Admitting: Radiology

## 2020-11-17 ENCOUNTER — Emergency Department (HOSPITAL_BASED_OUTPATIENT_CLINIC_OR_DEPARTMENT_OTHER)
Admission: EM | Admit: 2020-11-17 | Discharge: 2020-11-17 | Disposition: A | Discharge status: Home / Self Care | Payer: BC Managed Care – PPO | Attending: Emergency Medicine | Admitting: Emergency Medicine

## 2020-11-17 ENCOUNTER — Encounter (HOSPITAL_BASED_OUTPATIENT_CLINIC_OR_DEPARTMENT_OTHER): Payer: Self-pay | Admitting: *Deleted

## 2020-11-17 ENCOUNTER — Other Ambulatory Visit: Payer: Self-pay

## 2020-11-17 DIAGNOSIS — J029 Acute pharyngitis, unspecified: Secondary | ICD-10-CM | POA: Diagnosis present

## 2020-11-17 DIAGNOSIS — Z20822 Contact with and (suspected) exposure to covid-19: Secondary | ICD-10-CM | POA: Insufficient documentation

## 2020-11-17 DIAGNOSIS — J181 Lobar pneumonia, unspecified organism: Secondary | ICD-10-CM | POA: Insufficient documentation

## 2020-11-17 DIAGNOSIS — Z7722 Contact with and (suspected) exposure to environmental tobacco smoke (acute) (chronic): Secondary | ICD-10-CM | POA: Diagnosis not present

## 2020-11-17 DIAGNOSIS — J189 Pneumonia, unspecified organism: Secondary | ICD-10-CM

## 2020-11-17 LAB — RESP PANEL BY RT-PCR (FLU A&B, COVID) ARPGX2
Influenza A by PCR: NEGATIVE
Influenza B by PCR: NEGATIVE
SARS Coronavirus 2 by RT PCR: NEGATIVE

## 2020-11-17 MED ORDER — AMOXICILLIN-POT CLAVULANATE 875-125 MG PO TABS: 1.0000 | ORAL_TABLET | Freq: Two times a day (BID) | ORAL | 0 refills | Status: AC

## 2020-11-17 NOTE — ED Triage Notes (Signed)
Pt woke an hour ago with a slight sore throat and some blood streaked secretions.

## 2020-11-17 NOTE — Discharge Instructions (Addendum)
If you develop high fever, severe cough or cough with blood, trouble breathing, severe headache, neck pain/stiffness, vomiting, or any other new/concerning symptoms then return to the ER for evaluation   You will need a repeat chest x-ray after you have finished the antibiotics to ensure that the pneumonia/opacity clears away.

## 2020-11-17 NOTE — ED Notes (Signed)
Pt verbalizes understanding of discharge instructions. Opportunity for questioning and answers were provided. Armand removed by staff, pt discharged from ED to home. Educated to pick up Rx.  

## 2020-11-17 NOTE — ED Notes (Signed)
States for sore throat.  Denies short of breath; cough or fever.

## 2020-11-17 NOTE — ED Provider Notes (Signed)
[facility] EMERGENCY DEPT Provider Note   CSN: [id number] Arrival date & time: 11/17/20  1843     History Chief Complaint  Patient presents with   Sore Throat    Ryan Russell is a 40 y.o. male.  HPI 40 year old male presents with spitting up some blood.  He is not sure where was coming from.  Started after he woke up from a nap and he felt like he had something in his throat that he needed to cough up.  Was able to spit up some mucus that was saliva and blood mixed together.  Did this for a few minutes.  Since then he has had a little bit of a sore throat.  No fevers.  He does not know of any nose bleeding.   Past Medical History:  Diagnosis Date   Allergy    Anxiety    intermittent   GERD (gastroesophageal reflux disease)    Hearing loss 2014   mild, high frequency, audiology eval    History of back pain    prior physical therapy   Leukopenia    Hematology eval 2012, Dr. Dalene Carrow, felt to be ethnicity related   Wears glasses     Patient Active Problem List   Diagnosis Date Noted   Hemorrhoids, external 07/06/2011   GERD (gastroesophageal reflux disease) 11/07/2010   Allergic rhinitis 11/07/2010   BRADYCARDIA 08/23/2008    History reviewed. No pertinent surgical history.     Family History  Problem Relation Age of Onset   Cancer Mother        liver   Cancer Father        ? possibly colon   Fibromyalgia Sister    Diabetes Other    Hypertension Other    Heart disease Neg Hx     Social History   Tobacco Use   Smoking status: Passive Smoke Exposure - Never Smoker   Smokeless tobacco: Never  Vaping Use   Vaping Use: Never used  Substance Use Topics   Alcohol use: No   Drug use: No    Home Medications Prior to Admission medications   Medication Sig Start Date End Date Taking? Authorizing Provider  amoxicillin-clavulanate (AUGMENTIN) 875-125 MG tablet Take 1 tablet by mouth 2 (two) times daily. One po bid x 7 days 11/17/20  Yes Pricilla Loveless, MD  acetaminophen (TYLENOL) 325 MG tablet Take 325 mg by mouth every 6 (six) hours as needed.    [provider]    Allergies    Patient has no known allergies.  Review of Systems   Review of Systems  Constitutional:  Negative for fever.  HENT:  Positive for sore throat. Negative for nosebleeds.   Respiratory:  Negative for cough and shortness of breath.   All other systems reviewed and are negative.  Physical Exam Updated Vital Signs BP 111/73 (BP Location: Right Arm)   Pulse 63   Temp 97.8 F (36.6 C) (Oral)   Resp 17   Ht 5\' 11"  (1.803 m)   Wt 70.3 kg   SpO2 100%   BMI 21.62 kg/m   Physical Exam Vitals and nursing note reviewed.  Constitutional:      General: He is not in acute distress.    Appearance: He is well-developed. He is not ill-appearing or diaphoretic.  HENT:     Head: Normocephalic and atraumatic.     Right Ear: External ear normal.     Left Ear: External ear normal.  Nose: Nose normal.     Right Nostril: No epistaxis.     Left Nostril: No epistaxis.     Mouth/Throat:     Pharynx: No oropharyngeal exudate or posterior oropharyngeal erythema.     Tonsils: No tonsillar exudate.  Eyes:     General:        Right eye: No discharge.        Left eye: No discharge.  Cardiovascular:     Rate and Rhythm: Normal rate and regular rhythm.     Heart sounds: Normal heart sounds.  Pulmonary:     Effort: Pulmonary effort is normal.     Breath sounds: Normal breath sounds.  Abdominal:     Palpations: Abdomen is soft.     Tenderness: There is no abdominal tenderness.  Musculoskeletal:     Cervical back: Neck supple.  Skin:    General: Skin is warm and dry.  Neurological:     Mental Status: He is alert.  Psychiatric:        Mood and Affect: Mood is not anxious.    ED Results / Procedures / Treatments   Labs (all labs ordered are listed, but only abnormal results are displayed) Labs Reviewed  RESP PANEL BY RT-PCR (FLU A&B, COVID)  ARPGX2    EKG None  Radiology DG Chest 2 View  Result Date: 11/17/2020 CLINICAL DATA:  Cough with blood. EXAM: CHEST - 2 VIEW COMPARISON:  None. FINDINGS: Mild right perihilar opacity. No other pulmonary infiltrates. No nodules or masses. The heart, hila, mediastinum are normal. No pneumothorax identified. IMPRESSION: Mild right perihilar opacity/infiltrate could represent subtle pneumonia. Recommend short-term follow-up imaging to ensure resolution. Electronically Signed   By: Gerome Sam III M.D   On: 11/17/2020 19:41    Procedures Procedures   Medications Ordered in ED Medications - No data to display  ED Course  I have reviewed the triage vital signs and the nursing notes.  Pertinent labs & imaging results that were available during my care of the patient were reviewed by me and considered in my medical decision making (see chart for details).    MDM Rules/Calculators/A&P                          Patient's nose and oropharynx are clear.  Chest x-ray was obtained given the hemoptysis component and there is a likely right-sided pneumonia.  Given this, will treat with antibiotics and discussed that he needs a follow-up with his PCP for a recheck x-ray to make sure this resolves.  He otherwise is well-appearing and appears stable for discharge home for outpatient management. Final Clinical Impression(s) / ED Diagnoses Final diagnoses:  Community acquired pneumonia of right middle lobe of lung    Rx / DC Orders ED Discharge Orders          Ordered    amoxicillin-clavulanate (AUGMENTIN) 875-125 MG tablet  2 times daily        11/17/20 Virgina Evener, MD 11/17/20 2233

## 2020-11-18 ENCOUNTER — Telehealth: Payer: Self-pay

## 2020-11-18 NOTE — Telephone Encounter (Signed)
LVM for pt to call back and schedule a hospital follow. Per [employer] he would like to advise the pt that he can work now. KH

## 2020-11-19 ENCOUNTER — Telehealth: Payer: Self-pay

## 2020-11-19 NOTE — Telephone Encounter (Signed)
Called pt twice to advise of the need of a follow up appointment and to confirm he is working. KH
